# Patient Record
Sex: Female | Born: 1985 | Race: Black or African American | Hispanic: No | Marital: Single | Smoking: Current every day smoker
Health system: Southern US, Community
[De-identification: ages and names within clinical notes are randomized; demographics above are authoritative.]

## PROBLEM LIST (undated history)

## (undated) DIAGNOSIS — M7072 Other bursitis of hip, left hip: Secondary | ICD-10-CM

## (undated) DIAGNOSIS — C801 Malignant (primary) neoplasm, unspecified: Secondary | ICD-10-CM

## (undated) DIAGNOSIS — M722 Plantar fascial fibromatosis: Secondary | ICD-10-CM

## (undated) DIAGNOSIS — S0300XA Dislocation of jaw, unspecified side, initial encounter: Secondary | ICD-10-CM

## (undated) DIAGNOSIS — J45909 Unspecified asthma, uncomplicated: Secondary | ICD-10-CM

## (undated) HISTORY — PX: LAPAROSCOPIC OVARIAN CYSTECTOMY: SUR786

---
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
## 2009-04-10 ENCOUNTER — Ambulatory Visit: Payer: Self-pay | Admitting: Physician Assistant

## 2009-04-10 ENCOUNTER — Inpatient Hospital Stay (HOSPITAL_COMMUNITY): Admission: AD | Admit: 2009-04-10 | Discharge: 2009-04-10 | Payer: Self-pay | Admitting: Obstetrics & Gynecology

## 2010-08-25 LAB — URINALYSIS, ROUTINE W REFLEX MICROSCOPIC
Bilirubin Urine: NEGATIVE
Glucose, UA: NEGATIVE mg/dL
Hgb urine dipstick: NEGATIVE
Ketones, ur: NEGATIVE mg/dL
Nitrite: NEGATIVE
Protein, ur: NEGATIVE mg/dL
Specific Gravity, Urine: >1.03 — ABNORMAL HIGH (ref 1.005–1.030)
Urobilinogen, UA: 0.2 mg/dL (ref 0.0–1.0)
pH: 5.5 (ref 5.0–8.0)

## 2010-08-25 LAB — WET PREP, GENITAL
Trich, Wet Prep: NONE SEEN
Yeast Wet Prep HPF POC: NONE SEEN

## 2010-08-25 LAB — POCT PREGNANCY, URINE: Preg Test, Ur: NEGATIVE

## 2010-08-25 LAB — GC/CHLAMYDIA PROBE AMP, GENITAL
Chlamydia, DNA Probe: NEGATIVE
GC Probe Amp, Genital: NEGATIVE

## 2013-12-12 ENCOUNTER — Emergency Department (HOSPITAL_BASED_OUTPATIENT_CLINIC_OR_DEPARTMENT_OTHER)
Admission: EM | Admit: 2013-12-12 | Discharge: 2013-12-12 | Disposition: A | Discharge status: Home / Self Care | Payer: Self-pay | Attending: Emergency Medicine | Admitting: Emergency Medicine

## 2013-12-12 ENCOUNTER — Encounter (HOSPITAL_BASED_OUTPATIENT_CLINIC_OR_DEPARTMENT_OTHER): Payer: Self-pay | Admitting: Emergency Medicine

## 2013-12-12 DIAGNOSIS — M26659 Arthropathy of unspecified temporomandibular joint: Secondary | ICD-10-CM

## 2013-12-12 DIAGNOSIS — Z791 Long term (current) use of non-steroidal anti-inflammatories (NSAID): Secondary | ICD-10-CM | POA: Insufficient documentation

## 2013-12-12 DIAGNOSIS — Z3202 Encounter for pregnancy test, result negative: Secondary | ICD-10-CM | POA: Insufficient documentation

## 2013-12-12 DIAGNOSIS — M26609 Unspecified temporomandibular joint disorder, unspecified side: Secondary | ICD-10-CM | POA: Insufficient documentation

## 2013-12-12 DIAGNOSIS — Z79899 Other long term (current) drug therapy: Secondary | ICD-10-CM | POA: Insufficient documentation

## 2013-12-12 DIAGNOSIS — K029 Dental caries, unspecified: Secondary | ICD-10-CM | POA: Insufficient documentation

## 2013-12-12 DIAGNOSIS — F17200 Nicotine dependence, unspecified, uncomplicated: Secondary | ICD-10-CM | POA: Insufficient documentation

## 2013-12-12 DIAGNOSIS — F458 Other somatoform disorders: Secondary | ICD-10-CM

## 2013-12-12 DIAGNOSIS — G4763 Sleep related bruxism: Secondary | ICD-10-CM | POA: Insufficient documentation

## 2013-12-12 DIAGNOSIS — J45909 Unspecified asthma, uncomplicated: Secondary | ICD-10-CM | POA: Insufficient documentation

## 2013-12-12 HISTORY — DX: Dislocation of jaw, unspecified side, initial encounter: S03.00XA

## 2013-12-12 HISTORY — DX: Unspecified asthma, uncomplicated: J45.909

## 2013-12-12 LAB — PREGNANCY, URINE: PREG TEST UR: NEGATIVE

## 2013-12-12 MED ORDER — ONDANSETRON 8 MG PO TBDP
8.0000 mg | ORAL_TABLET | Freq: Once | ORAL | Status: AC
  Administered 2013-12-12: 8 mg via ORAL
  Filled 2013-12-12: qty 1

## 2013-12-12 MED ORDER — DEXAMETHASONE SODIUM PHOSPHATE 4 MG/ML IJ SOLN
INTRAMUSCULAR | Status: AC
  Administered 2013-12-12: 8 mg
  Filled 2013-12-12: qty 2

## 2013-12-12 MED ORDER — PENICILLIN V POTASSIUM 250 MG PO TABS
500.0000 mg | ORAL_TABLET | Freq: Once | ORAL | Status: AC
Start: 2013-12-12 — End: 2013-12-12
  Administered 2013-12-12: 500 mg via ORAL
  Filled 2013-12-12: qty 2

## 2013-12-12 MED ORDER — KETOROLAC TROMETHAMINE 60 MG/2ML IM SOLN
60.0000 mg | Freq: Once | INTRAMUSCULAR | Status: AC
  Administered 2013-12-12: 60 mg via INTRAMUSCULAR
  Filled 2013-12-12: qty 2

## 2013-12-12 MED ORDER — DEXAMETHASONE SODIUM PHOSPHATE 10 MG/ML IJ SOLN: 8.0000 mg | Freq: Once | INTRAMUSCULAR | Status: DC

## 2013-12-12 MED ORDER — PENICILLIN V POTASSIUM 500 MG PO TABS: 500.0000 mg | ORAL_TABLET | Freq: Four times a day (QID) | ORAL | Status: AC

## 2013-12-12 MED ORDER — DEXAMETHASONE SODIUM PHOSPHATE 10 MG/ML IJ SOLN: 10.0000 mg | Freq: Once | INTRAMUSCULAR | Status: DC

## 2013-12-12 MED ORDER — NAPROXEN 500 MG PO TABS: 500.0000 mg | ORAL_TABLET | Freq: Two times a day (BID) | ORAL | Status: DC

## 2013-12-12 NOTE — ED Notes (Signed)
Pt c/o headache x2wks with having had TMJ since 10/22/13, was treaded at [facility] but unable to afford the meds

## 2013-12-12 NOTE — Discharge Instructions (Signed)
°Emergency Department Resource Guide °1) Find a Doctor and Pay Out of Pocket °Although you won't have to find out who is covered by your insurance plan, it is a good idea to ask around and get recommendations. You will then need to call the office and see if the doctor you have chosen will accept you as a new patient and what types of options they offer for patients who are self-pay. Some doctors offer discounts or will set up payment plans for their patients who do not have insurance, but you will need to ask so you aren't surprised when you get to your appointment. ° °2) Contact Your Local Health Department °Not all health departments have doctors that can see patients for sick visits, but many do, so it is worth a call to see if yours does. If you don't know where your local health department is, you can check in your phone book. The CDC also has a tool to help you locate your state's health department, and many state websites also have listings of all of their local health departments. ° °3) Find a Walk-in Clinic °If your illness is not likely to be very severe or complicated, you may want to try a walk in clinic. These are popping up all over the country in pharmacies, drugstores, and shopping centers. They're usually staffed by nurse practitioners or physician assistants that have been trained to treat common illnesses and complaints. They're usually fairly quick and inexpensive. However, if you have serious medical issues or chronic medical problems, these are probably not your best option. ° °No Primary Care Doctor: °- Call [facility] at  832-8000 - they can help you locate a primary care doctor that  accepts your insurance, provides certain services, etc. °- Physician Referral Service- 1-800-533-3463 ° °Chronic Pain Problems: °Organization         Address  Phone   Notes  °[facility]  (336) 297-2271 Patients need to be referred by their primary care doctor.  ° °Medication  Assistance: °Organization         Address  Phone   Notes  °[place] [facility] [address] °[place], [place] [address] (336) 641-8030 --Must be a resident of [place] °-- Must have NO insurance coverage whatsoever (no Medicaid/ Medicare, etc.) °-- The pt. MUST have a primary care doctor that directs their care regularly and follows them in the community °  °[facility]  (866) 331-1348   °[employer]  (888) 892-1162   ° °Agencies that provide inexpensive medical care: °Organization         Address  Phone   Notes  °[facility]  (336) 832-8035   °[facility]    (336) 832-7272   °[facility] [address] °[place], [place] [address] (336) 832-4777   °[facility] [address], °[place] (336) 271-4999   °[facility]    (336) 373-0678   °[facility]    (336) 272-1050   °[facility] ° [address], [place] Phone:  (336) 832-4444, Fax:  (336) 832-4440 Hours of Operation:  9 am - 6 pm, M-F.  Also accepts Medicaid/Medicare and self-pay.  °[facility] ° [address], [place] Phone: (336) 832-3150, Fax: (336) 832-3151. Hours of Operation:  8:30 am - 5:30 pm, M-F.  Also accepts Medicaid and self-pay.  °[facility] [place] [address], [place] Phone: (336) 878-6027   °[facility] [address], [place], [place] (336)723-1848, Ext. 123 Mondays & Thursdays: 7-9 AM.  First 15 patients are seen on a first come, first serve basis. °  ° °Medicaid-accepting [place] Providers: ° °Organization         Address  Phone   Notes  °[facility] [address], [place] (336) 641-2100 Also accepts self-pay patients.  °[facility] [address], [place] ° (336) 856-9996   °[facility] [address], [place]  (336) 288-8857   °[facility] [address] [place] [address], [place] (336) 299-7000   °Veita Bland [address], [place]  ° (336) 373-1557 Only accepts [place] Access Medicaid patients after they have their name applied to their card.  ° °Self-Pay (no insurance) in [place]: ° °Organization         Address  Phone   Notes  °Sickle Cell Patients, [facility] [address], [place] (336) 832-1970   °[facility] [address], [place] (336) 832-4400   °[facility] ° [address] [place] [address],  (336) 992-4800   °[facility]/Dr. Osei-Bonsu ° [address] [place] [address], [place] or [address], [place] (336) 841-8500 Phone number for both [place] and [place] locations is the same.  °[facility] [address], [place] (336) 299-0000   °[facility] [address] [place] [address], [place] or [address] (336) 852-7530 °(336) 878-2260   °[facility] [address], [place] (336) 350-1642, phone; (336) 294-5005, fax Sees patients 1st and 3rd Saturday of every month.  Must not qualify for public or private insurance (i.e. Medicaid, Medicare, [place] Health Choice, Veterans' Benefits) • Household income should be no more than 200% of the poverty level •The clinic cannot treat you if you are pregnant or think you are pregnant • Sexually transmitted diseases are not treated at the clinic.  ° ° °Dental Care: °Organization         Address  Phone  Notes  °[place] [facility] [address], [place] (336) 641-6152 Accepts children up to age 21 who are enrolled in Medicaid or [place] Health Choice; pregnant women with a Medicaid card; and children who have applied for Medicaid or [place] Health Choice, but were declined, whose parents can pay a reduced fee at time of service.  °[place]  [facility] [place]  [address], [place] (336) 641-7733 Accepts children up to age 21 who are enrolled in Medicaid or [place] Health Choice; pregnant women with a Medicaid card; and children who have applied for Medicaid or [place] Health Choice, but were declined, whose parents can pay a reduced fee at time of service.  °[facility] ° [address], [place] (336) 641-4533 Patients are seen by appointment only. Walk-ins are not accepted. [facility] will see patients 18 years of age and older. °Monday - Tuesday (8am-5pm) °Most Wednesdays (8:30-5pm) °$30 per visit, cash only  °[facility] ° [address], [place] (336) 641-4533 Patients are seen by appointment only. Walk-ins are not accepted. [facility] will see patients 18 years of age and older. °One   Wednesday Evening (Monthly: Volunteer Based).  $30 per visit, cash only  °[facility]  (919) 537-3737 for adults; Children under age 4, call Graduate Pediatric Dentistry at (919) [phone]. Children aged 4-14, please call (919) [phone] to request a pediatric application. ° Dental services are provided in all areas of dental care including fillings, crowns and bridges, complete and partial dentures, implants, gum treatment, root canals, and extractions. Preventive care is also provided. Treatment is provided to both adults and children. °Patients are selected via a lottery and there is often a waiting list. °  °[facility] [address], °[place] ° (336) 763-8833 [email or web address] °  °[facility] [address], [place], [place] (336)723-1848, Ext. 123 Second and Fourth Thursday of each month, opens at 6:30 AM; Clinic ends at 9 AM.  Patients are seen on a first-come first-served basis, and a limited number are seen during each clinic.  ° °[facility] ° [address], [place], [place] (336) 723-7904    Eligibility Requirements °You must have lived in [place], [place], or [place] counties for at least the last three months. °  You cannot be eligible for state or federal sponsored healthcare insurance, including Veterans Administration, Medicaid, or Medicare. °  You generally cannot be eligible for healthcare insurance through your employer.  °  How to apply: °Eligibility screenings are held every Tuesday and Wednesday afternoon from 1:00 pm until 4:00 pm. You do not need an appointment for the interview!  °[facility] [address], [place], [place] 336-631-2330   °[facility] [facility]  336-342-8273   °[facility]  336-703-3100   °[facility]  336-570-6415   ° °Behavioral Health Resources in the Community: °Intensive Outpatient Programs °Organization         Address  Phone  Notes  °[facility] [address], [place], [place] 336-878-6098   °[facility] [address], [place], [place] 336-832-9800   °[facility] [address], [place], [place] ° 336-882-2125   °[facility] [address],  °[place], [place] 1-800-853-5163 or 336-641-4981   °Substance Abuse Resources °Organization         Address  Phone  Notes  °[facility]  336-882-2125   °[facility]  336-784-9470   °[facility]  336-285-9073   °[facility]  336-845-3988   °Residential & Outpatient Substance Abuse Program  1-800-659-3381   °Psychological Services °Organization         Address  Phone  Notes  °[facility]  336- 832-9600   °[facility]  336- 378-7881   °[facility] [address], [place] 1-800-853-5163 or 336-641-4981   ° °Mobile Crisis Teams °Organization         Address  Phone  Notes  °[facility], Mobile Crisis Care Unit  1-877-626-1772   °Assertive °[facility] ° [address]  [place], [place] 336-834-9664   °Sharon DeEsch [address] °[place] 336-554-5454   ° °Self-Help/Support Groups °Organization         Address  Phone             Notes  °[facility] [place] - variety of support groups  336- 373-1402 Call for more information  °Narcotics Anonymous (NA), [facility] [address], °[place]   2 meetings at this location  ° °  Residential Treatment Programs °Organization         Address  Phone  Notes  °[facility] [address],    °[place]  1-866-801-8205   °[facility] ° [address], [place], [place] 704-293-8524   °[facility] [address], [place] 336-845-3988 Admissions: 8am-3pm M-F  °[facility] [address]    °[place], [place] 336-841-1104   °[facility] [address], [place], [place] 336-379-7146   °[facility] [address]  °[place], [place] 336-285-9073   °[facility] - Intensive Outpatient [address], [place], [place] 336-852-3033   °[facility] [address]  °[place], [place] 1-877-615-2722 or 336-784-9470   °[facility] [address] [place], [place] 336-227-7417 Accepts Medicaid  °[facility] [address]  °[place] 1-800-659-3381 Substance Abuse/Addiction Treatment  ° °[place] Behavioral Health Resources °Organization         Address  Phone  Notes  °[facility]  (888) 581-9988   °Julie Brannon, PhD [address] [place], [place]   (336) 349-5553 or (336) 951-0000   °[facility]   [address] °[place], [place] (336) 349-4454   °[facility] [address], [place], [place] (336) 342-8316 Insurance/Medicaid/sponsorship through [facility]  °[facility] [address]                                    [place], [place] (336) 342-8316 Therapy/tele-psych/case    °[facility] [address]  ° [place], [place] (336) 349-2233    °Dr. Arfeen  (336) 349-4544   °[facility] [place]  [employer] [place] [facility] 1) [address], [place] °2) [address], [place] °3)  [address], [place] (336) 349-3220 °(336) 342-7768 ° °(336) 342-8140   °[place] [facility] (336) 342-1394 or (336) 342-3537 (After Hours)    ° ° °

## 2013-12-12 NOTE — ED Notes (Signed)
MD at bedside. 

## 2013-12-12 NOTE — ED Provider Notes (Signed)
CSN: [id number]     Arrival date & time 12/12/13  0258 History   First MD Initiated Contact with Patient 12/12/13 [phone]     Chief Complaint  Patient presents with  . Headache     (Consider location/radiation/quality/duration/timing/severity/associated sxs/prior Treatment) Patient is a 28 y.o. female presenting with headaches. The history is provided by the patient.  Headache Pain location:  L temporal and R temporal Quality:  Dull Radiates to:  Does not radiate Severity currently:  9/10 Severity at highest:  9/10 Onset quality:  Gradual Timing:  Constant Progression:  Unchanged Chronicity:  New Context: not activity and not loud noise   Relieved by:  Nothing Worsened by:  Nothing tried Ineffective treatments: tylenol. Associated symptoms: facial pain   Associated symptoms: no blurred vision, no congestion, no diarrhea, no ear pain, no fever, no hearing loss, no myalgias, no neck pain, no neck stiffness, no paresthesias, no photophobia, no sinus pressure, no sore throat, no URI and no weakness   Risk factors: no anger     Past Medical History  Diagnosis Date  . TMJ (dislocation of temporomandibular joint)   . Asthma    Past Surgical History  Procedure Laterality Date  . Laparoscopic ovarian cystectomy     No family history on file. History  Substance Use Topics  . Smoking status: Current Some Day Smoker  . Smokeless tobacco: Not on file  . Alcohol Use: No   OB History   Grav Para Term Preterm Abortions TAB SAB Ect Mult Living                 Review of Systems  Constitutional: Negative for fever.  HENT: Negative for congestion, drooling, ear pain, facial swelling, hearing loss, rhinorrhea, sinus pressure, sore throat, trouble swallowing and voice change.        Jaw pain  Eyes: Negative for blurred vision and photophobia.  Respiratory: Negative for shortness of breath.   Gastrointestinal: Negative for diarrhea.  Musculoskeletal: Negative for myalgias, neck pain  and neck stiffness.  Neurological: Positive for headaches. Negative for facial asymmetry, speech difficulty, weakness, light-headedness and paresthesias.  All other systems reviewed and are negative.     Allergies  Review of patient's allergies indicates no known allergies.  Home Medications   Prior to Admission medications   Medication Sig Start Date End Date Taking? Authorizing Provider  acetaminophen (TYLENOL) 500 MG tablet Take 500 mg by mouth every 6 (six) hours as needed.   Yes Historical Provider, MD  ibuprofen (ADVIL,MOTRIN) 200 MG tablet Take 200 mg by mouth every 6 (six) hours as needed.   Yes Historical Provider, MD  naproxen (NAPROSYN) 500 MG tablet Take 1 tablet (500 mg total) by mouth 2 (two) times daily. 12/12/13   Numair Masden K Ferrel [place]-Rasch, MD  penicillin v potassium (VEETID) 500 MG tablet Take 1 tablet (500 mg total) by mouth 4 (four) times daily. 12/12/13 12/19/13  Kayla Addison K Romaldo Saville-Rasch, MD   BP 148/93  Pulse 78  Temp(Src) 99 F (37.2 C) (Oral)  Resp 20  SpO2 100%  LMP 12/05/2013 Physical Exam  Constitutional: She is oriented to person, place, and time. She appears well-developed and well-nourished. No distress.  HENT:  Head: Normocephalic and atraumatic.  Mouth/Throat: Oropharynx is clear and moist. No oral lesions. No trismus in the jaw. No uvula swelling. No posterior oropharyngeal edema or posterior oropharyngeal erythema.    Eyes: Conjunctivae and EOM are normal. Pupils are equal, round, and reactive to light.  Neck: Normal range of  motion. Neck supple.  Cardiovascular: Normal rate, regular rhythm and intact distal pulses.   Pulmonary/Chest: Effort normal and breath sounds normal. No respiratory distress. She has no wheezes. She has no rales.  Abdominal: Soft. Bowel sounds are normal. There is no tenderness. There is no rebound and no guarding.  Musculoskeletal: Normal range of motion. She exhibits no edema and no tenderness.  Neurological: She is alert and  oriented to person, place, and time. She has normal reflexes. No cranial nerve deficit.  5/5 throughout  Skin: Skin is warm and dry.  Psychiatric: She has a normal mood and affect.    ED Course  Procedures (including critical care time) Labs Review Labs Reviewed  PREGNANCY, URINE    Imaging Review No results found.   EKG Interpretation None      MDM   Final diagnoses:  TMJ arthropathy  Dental caries  Bruxism (teeth grinding)   Headache is clearly related to TMJ and bite malalignment from teeth grinding.  There are also caries on the RL molars. Dental caries secondary to hypomineralization and grinding.  Will treat with NSAIDs, PCN, recommend dental follow up and mouth guard mouth rinses, good oral hygiene and soft diet.     Carlisle Beers, MD 12/12/13 [phone]

## 2013-12-12 NOTE — ED Notes (Signed)
MD at bedside. Pt given ginger ale to trial.

## 2014-02-18 ENCOUNTER — Emergency Department (HOSPITAL_BASED_OUTPATIENT_CLINIC_OR_DEPARTMENT_OTHER): Payer: Self-pay

## 2014-02-18 ENCOUNTER — Encounter (HOSPITAL_BASED_OUTPATIENT_CLINIC_OR_DEPARTMENT_OTHER): Payer: Self-pay | Admitting: Emergency Medicine

## 2014-02-18 ENCOUNTER — Emergency Department (HOSPITAL_BASED_OUTPATIENT_CLINIC_OR_DEPARTMENT_OTHER)
Admission: EM | Admit: 2014-02-18 | Discharge: 2014-02-18 | Disposition: A | Discharge status: Home / Self Care | Payer: Self-pay | Attending: Emergency Medicine | Admitting: Emergency Medicine

## 2014-02-18 DIAGNOSIS — R5383 Other fatigue: Secondary | ICD-10-CM

## 2014-02-18 DIAGNOSIS — Z79899 Other long term (current) drug therapy: Secondary | ICD-10-CM | POA: Insufficient documentation

## 2014-02-18 DIAGNOSIS — N898 Other specified noninflammatory disorders of vagina: Secondary | ICD-10-CM | POA: Insufficient documentation

## 2014-02-18 DIAGNOSIS — B349 Viral infection, unspecified: Secondary | ICD-10-CM

## 2014-02-18 DIAGNOSIS — J45909 Unspecified asthma, uncomplicated: Secondary | ICD-10-CM | POA: Insufficient documentation

## 2014-02-18 DIAGNOSIS — R509 Fever, unspecified: Secondary | ICD-10-CM | POA: Insufficient documentation

## 2014-02-18 DIAGNOSIS — Z3202 Encounter for pregnancy test, result negative: Secondary | ICD-10-CM | POA: Insufficient documentation

## 2014-02-18 DIAGNOSIS — R5381 Other malaise: Secondary | ICD-10-CM | POA: Insufficient documentation

## 2014-02-18 DIAGNOSIS — R0982 Postnasal drip: Secondary | ICD-10-CM | POA: Insufficient documentation

## 2014-02-18 DIAGNOSIS — R1084 Generalized abdominal pain: Secondary | ICD-10-CM | POA: Insufficient documentation

## 2014-02-18 DIAGNOSIS — IMO0001 Reserved for inherently not codable concepts without codable children: Secondary | ICD-10-CM | POA: Insufficient documentation

## 2014-02-18 DIAGNOSIS — B9789 Other viral agents as the cause of diseases classified elsewhere: Secondary | ICD-10-CM | POA: Insufficient documentation

## 2014-02-18 DIAGNOSIS — F17200 Nicotine dependence, unspecified, uncomplicated: Secondary | ICD-10-CM | POA: Insufficient documentation

## 2014-02-18 DIAGNOSIS — Z87828 Personal history of other (healed) physical injury and trauma: Secondary | ICD-10-CM | POA: Insufficient documentation

## 2014-02-18 LAB — URINALYSIS, ROUTINE W REFLEX MICROSCOPIC
Bilirubin Urine: NEGATIVE
GLUCOSE, UA: NEGATIVE mg/dL
Hgb urine dipstick: NEGATIVE
Ketones, ur: NEGATIVE mg/dL
LEUKOCYTES UA: NEGATIVE
NITRITE: NEGATIVE
PH: 6 (ref 5.0–8.0)
Protein, ur: NEGATIVE mg/dL
SPECIFIC GRAVITY, URINE: 1.022 (ref 1.005–1.030)
Urobilinogen, UA: 0.2 mg/dL (ref 0.0–1.0)

## 2014-02-18 LAB — COMPREHENSIVE METABOLIC PANEL
ALBUMIN: 3.5 g/dL (ref 3.5–5.2)
ALT: 16 U/L (ref 0–35)
AST: 16 U/L (ref 0–37)
Alkaline Phosphatase: 58 U/L (ref 39–117)
Anion gap: 14 (ref 5–15)
BUN: 7 mg/dL (ref 6–23)
CALCIUM: 8.7 mg/dL (ref 8.4–10.5)
CO2: 21 meq/L (ref 19–32)
CREATININE: 0.6 mg/dL (ref 0.50–1.10)
Chloride: 105 mEq/L (ref 96–112)
GFR calc Af Amer: >90 mL/min (ref >90)
GFR calc non Af Amer: >90 mL/min (ref >90)
Glucose, Bld: 114 mg/dL — ABNORMAL HIGH (ref 70–99)
Potassium: 3.7 mEq/L (ref 3.7–5.3)
SODIUM: 140 meq/L (ref 137–147)
Total Bilirubin: 0.4 mg/dL (ref 0.3–1.2)
Total Protein: 7.1 g/dL (ref 6.0–8.3)

## 2014-02-18 LAB — LIPASE, BLOOD: LIPASE: 20 U/L (ref 11–59)

## 2014-02-18 LAB — CBC WITH DIFFERENTIAL/PLATELET
BASOS ABS: 0 10*3/uL (ref 0.0–0.1)
BASOS PCT: 0 % (ref 0–1)
EOS PCT: 0 % (ref 0–5)
Eosinophils Absolute: 0 10*3/uL (ref 0.0–0.7)
HEMATOCRIT: 35.6 % — AB (ref 36.0–46.0)
Hemoglobin: 11.3 g/dL — ABNORMAL LOW (ref 12.0–15.0)
LYMPHS PCT: 11 % — AB (ref 12–46)
Lymphs Abs: 0.5 10*3/uL — ABNORMAL LOW (ref 0.7–4.0)
MCH: 27.8 pg (ref 26.0–34.0)
MCHC: 31.7 g/dL (ref 30.0–36.0)
MCV: 87.5 fL (ref 78.0–100.0)
MONO ABS: 0.4 10*3/uL (ref 0.1–1.0)
Monocytes Relative: 9 % (ref 3–12)
NEUTROS ABS: 3.6 10*3/uL (ref 1.7–7.7)
Neutrophils Relative %: 80 % — ABNORMAL HIGH (ref 43–77)
PLATELETS: 153 10*3/uL (ref 150–400)
RBC: 4.07 MIL/uL (ref 3.87–5.11)
RDW: 12.8 % (ref 11.5–15.5)
WBC: 4.5 10*3/uL (ref 4.0–10.5)

## 2014-02-18 LAB — PREGNANCY, URINE: PREG TEST UR: NEGATIVE

## 2014-02-18 MED ORDER — ACETAMINOPHEN 325 MG PO TABS
650.0000 mg | ORAL_TABLET | Freq: Once | ORAL | Status: AC
  Administered 2014-02-18: 650 mg via ORAL

## 2014-02-18 MED ORDER — ACETAMINOPHEN 325 MG PO TABS
ORAL_TABLET | ORAL | Status: AC
  Filled 2014-02-18: qty 2

## 2014-02-18 MED ORDER — METRONIDAZOLE 500 MG PO TABS: 500.0000 mg | ORAL_TABLET | Freq: Two times a day (BID) | ORAL | Status: DC

## 2014-02-18 NOTE — Discharge Instructions (Signed)

## 2014-02-18 NOTE — ED Notes (Addendum)
Pt c/o body aches fever congestion and cough x 6 hrs seen at [facility] ED last night DX BV

## 2014-02-18 NOTE — ED Notes (Signed)
MD at bedside. 

## 2014-02-18 NOTE — ED Provider Notes (Signed)
CSN: [id number]     Arrival date & time 02/18/14  0807 History   First MD Initiated Contact with Patient 02/18/14 0820     Chief Complaint  Patient presents with  . Fever     (Consider location/radiation/quality/duration/timing/severity/associated sxs/prior Treatment) HPI Comments: Pt presents with fever that started today.  She has had some right side abd cramps for about 1.5 weeks.  She went to [facility] ED yesterday and had a pelvic exam showing BV.  She also had some blood in her urine and says that she was told that she had a small amount of blood on her vaginal exam.  Denies dysuria or frequency.  Today, started having fever up to 102.  Has some mucus in her throat, but no significant sore throat.  +cough with chest congestion.  Diffuse myalgias.  Diffuse discomfort around her abdomen.  +nausea, but no vomiting or diarrhea.  No rash.  Patient is a 28 y.o. female presenting with fever.  Fever Associated symptoms: congestion, cough, myalgias and nausea   Associated symptoms: no chest pain, no chills, no diarrhea, no headaches, no rash, no rhinorrhea and no vomiting     Past Medical History  Diagnosis Date  . TMJ (dislocation of temporomandibular joint)   . Asthma    Past Surgical History  Procedure Laterality Date  . Laparoscopic ovarian cystectomy     History reviewed. No pertinent family history. History  Substance Use Topics  . Smoking status: Current Every Day Smoker -- 0.50 packs/day    Types: Cigarettes  . Smokeless tobacco: Not on file  . Alcohol Use: No   OB History   Grav Para Term Preterm Abortions TAB SAB Ect Mult Living                 Review of Systems  Constitutional: Positive for fever and fatigue. Negative for chills and diaphoresis.  HENT: Positive for congestion and postnasal drip. Negative for rhinorrhea and sneezing.   Eyes: Negative.   Respiratory: Positive for cough. Negative for chest tightness and shortness of breath.   Cardiovascular: Negative for  chest pain and leg swelling.  Gastrointestinal: Positive for nausea and abdominal pain. Negative for vomiting, diarrhea and blood in stool.  Genitourinary: Positive for vaginal discharge. Negative for frequency, hematuria, flank pain, vaginal bleeding and difficulty urinating.  Musculoskeletal: Positive for myalgias. Negative for arthralgias and back pain.  Skin: Negative for rash.  Neurological: Negative for dizziness, speech difficulty, weakness, numbness and headaches.      Allergies  Review of patient's allergies indicates no known allergies.  Home Medications   Prior to Admission medications   Medication Sig Start Date End Date Taking? Authorizing Provider  acetaminophen (TYLENOL) 500 MG tablet Take 500 mg by mouth every 6 (six) hours as needed.    Historical Provider, MD  ibuprofen (ADVIL,MOTRIN) 200 MG tablet Take 200 mg by mouth every 6 (six) hours as needed.    Historical Provider, MD  metroNIDAZOLE (FLAGYL) 500 MG tablet Take 1 tablet (500 mg total) by mouth 2 (two) times daily. One po bid x 7 days 02/18/14   Malvin Johns, MD  naproxen (NAPROSYN) 500 MG tablet Take 1 tablet (500 mg total) by mouth 2 (two) times daily. 12/12/13   April K Palumbo-Rasch, MD   BP 133/59  Pulse 81  Temp(Src) 99.2 F (37.3 C) (Oral)  Resp 20  Ht 5\' 7"  (1.702 m)  Wt 280 lb (127.007 kg)  BMI 43.84 kg/m2  SpO2 99%  LMP 12/21/2013 Physical  Exam  Constitutional: She is oriented to person, place, and time. She appears well-developed and well-nourished. No distress.  HENT:  Head: Normocephalic and atraumatic.  Mouth/Throat: Oropharynx is clear and moist. No oropharyngeal exudate.  Eyes: Pupils are equal, round, and reactive to light.  Neck: Normal range of motion. Neck supple.  Cardiovascular: Normal rate, regular rhythm and normal heart sounds.   Pulmonary/Chest: Effort normal and breath sounds normal. No respiratory distress. She has no wheezes. She has no rales. She exhibits no tenderness.   Abdominal: Soft. Bowel sounds are normal. There is tenderness (mild diffuse tenderness). There is no rebound and no guarding.  Musculoskeletal: Normal range of motion. She exhibits no edema.  Lymphadenopathy:    She has no cervical adenopathy.  Neurological: She is alert and oriented to person, place, and time.  Skin: Skin is warm and dry. No rash noted.  Psychiatric: She has a normal mood and affect.    ED Course  Procedures (including critical care time) Labs Review Labs Reviewed  CBC WITH DIFFERENTIAL - Abnormal; Notable for the following:    Hemoglobin 11.3 (*)    HCT 35.6 (*)    Neutrophils Relative % 80 (*)    Lymphocytes Relative 11 (*)    Lymphs Abs 0.5 (*)    All other components within normal limits  COMPREHENSIVE METABOLIC PANEL - Abnormal; Notable for the following:    Glucose, Bld 114 (*)    All other components within normal limits  LIPASE, BLOOD  URINALYSIS, ROUTINE W REFLEX MICROSCOPIC  PREGNANCY, URINE    Imaging Review Dg Abd Acute W/chest  02/18/2014   CLINICAL DATA:  Several day history of abdominal and low back pain as well as fever  EXAM: ACUTE ABDOMEN SERIES (ABDOMEN 2 VIEW & CHEST 1 VIEW)  COMPARISON:  None.  FINDINGS: The lungs are adequately inflated. There is no focal infiltrate. The heart and mediastinal structures are normal.  Within the abdomen the bowel gas pattern is nonspecific. There are small amounts of small bowel gas. A normal stool and gas pattern within the colon is present. There are no free extraluminal gas collections. There are no abnormal soft tissue calcifications. The bony structures are unremarkable.  IMPRESSION: 1. There is no acute cardiopulmonary abnormality. 2. There is no evidence of bowel obstruction. There are small amounts of small bowel gas which may be normal for the patient or could reflect mild enteritis.   Electronically Signed   By: David  Martinique   On: 02/18/2014 09:26     EKG Interpretation None      MDM   Final  diagnoses:  Viral syndrome   Patient presents with fever, cough and myalgias. She did have some abdominal pain initially. Her labs are unremarkable. There is no evidence of pneumonia. Her urine is negative for infection. I reexamined her abdomen reveals only mild diffuse tenderness. Exam is not concerning at this time for cholecystitis, appendicitis or renal colic. I feel she likely has a viral infection. She was advised in symptomatic care. She does have a history of recent bacterial vaginosis but was unable to get her MetroGel filled due to the cost. I gave her a prescription for oral Flagyl. Encouraged her to followup with her primary care physician return here she has any worsening symptoms.    Malvin Johns, MD 02/18/14 [phone]

## 2014-04-10 ENCOUNTER — Encounter (HOSPITAL_BASED_OUTPATIENT_CLINIC_OR_DEPARTMENT_OTHER): Payer: Self-pay | Admitting: Emergency Medicine

## 2014-04-10 ENCOUNTER — Emergency Department (HOSPITAL_BASED_OUTPATIENT_CLINIC_OR_DEPARTMENT_OTHER)
Admission: EM | Admit: 2014-04-10 | Discharge: 2014-04-11 | Disposition: A | Discharge status: Home / Self Care | Payer: Self-pay | Attending: Emergency Medicine | Admitting: Emergency Medicine

## 2014-04-10 DIAGNOSIS — R1084 Generalized abdominal pain: Secondary | ICD-10-CM | POA: Insufficient documentation

## 2014-04-10 DIAGNOSIS — Z9889 Other specified postprocedural states: Secondary | ICD-10-CM | POA: Insufficient documentation

## 2014-04-10 DIAGNOSIS — K5900 Constipation, unspecified: Secondary | ICD-10-CM | POA: Insufficient documentation

## 2014-04-10 DIAGNOSIS — Z791 Long term (current) use of non-steroidal anti-inflammatories (NSAID): Secondary | ICD-10-CM | POA: Insufficient documentation

## 2014-04-10 DIAGNOSIS — Z720 Tobacco use: Secondary | ICD-10-CM | POA: Insufficient documentation

## 2014-04-10 DIAGNOSIS — J45901 Unspecified asthma with (acute) exacerbation: Secondary | ICD-10-CM | POA: Insufficient documentation

## 2014-04-10 DIAGNOSIS — R51 Headache: Secondary | ICD-10-CM | POA: Insufficient documentation

## 2014-04-10 DIAGNOSIS — R1030 Lower abdominal pain, unspecified: Secondary | ICD-10-CM

## 2014-04-10 DIAGNOSIS — Z87828 Personal history of other (healed) physical injury and trauma: Secondary | ICD-10-CM | POA: Insufficient documentation

## 2014-04-10 DIAGNOSIS — R630 Anorexia: Secondary | ICD-10-CM | POA: Insufficient documentation

## 2014-04-10 DIAGNOSIS — Z3202 Encounter for pregnancy test, result negative: Secondary | ICD-10-CM | POA: Insufficient documentation

## 2014-04-10 DIAGNOSIS — R112 Nausea with vomiting, unspecified: Secondary | ICD-10-CM | POA: Insufficient documentation

## 2014-04-10 DIAGNOSIS — N898 Other specified noninflammatory disorders of vagina: Secondary | ICD-10-CM | POA: Insufficient documentation

## 2014-04-10 LAB — URINALYSIS, ROUTINE W REFLEX MICROSCOPIC
Glucose, UA: NEGATIVE mg/dL
HGB URINE DIPSTICK: NEGATIVE
Ketones, ur: 15 mg/dL — AB
LEUKOCYTES UA: NEGATIVE
NITRITE: NEGATIVE
PROTEIN: NEGATIVE mg/dL
SPECIFIC GRAVITY, URINE: 1.039 — AB (ref 1.005–1.030)
UROBILINOGEN UA: 1 mg/dL (ref 0.0–1.0)
pH: 5.5 (ref 5.0–8.0)

## 2014-04-10 LAB — COMPREHENSIVE METABOLIC PANEL
ALBUMIN: 3.5 g/dL (ref 3.5–5.2)
ALT: 17 U/L (ref 0–35)
AST: 16 U/L (ref 0–37)
Alkaline Phosphatase: 63 U/L (ref 39–117)
Anion gap: 13 (ref 5–15)
BUN: 8 mg/dL (ref 6–23)
CALCIUM: 8.8 mg/dL (ref 8.4–10.5)
CHLORIDE: 105 meq/L (ref 96–112)
CO2: 25 mEq/L (ref 19–32)
CREATININE: 0.6 mg/dL (ref 0.50–1.10)
GFR calc Af Amer: >90 mL/min (ref >90)
GFR calc non Af Amer: >90 mL/min (ref >90)
Glucose, Bld: 94 mg/dL (ref 70–99)
Potassium: 3.7 mEq/L (ref 3.7–5.3)
SODIUM: 143 meq/L (ref 137–147)
Total Bilirubin: <0.2 mg/dL — ABNORMAL LOW (ref 0.3–1.2)
Total Protein: 7.1 g/dL (ref 6.0–8.3)

## 2014-04-10 LAB — CBC WITH DIFFERENTIAL/PLATELET
BASOS ABS: 0 10*3/uL (ref 0.0–0.1)
BASOS PCT: 0 % (ref 0–1)
EOS PCT: 1 % (ref 0–5)
Eosinophils Absolute: 0 10*3/uL (ref 0.0–0.7)
HEMATOCRIT: 36.7 % (ref 36.0–46.0)
Hemoglobin: 11.6 g/dL — ABNORMAL LOW (ref 12.0–15.0)
Lymphocytes Relative: 47 % — ABNORMAL HIGH (ref 12–46)
Lymphs Abs: 2.6 10*3/uL (ref 0.7–4.0)
MCH: 27.6 pg (ref 26.0–34.0)
MCHC: 31.6 g/dL (ref 30.0–36.0)
MCV: 87.2 fL (ref 78.0–100.0)
MONO ABS: 0.4 10*3/uL (ref 0.1–1.0)
Monocytes Relative: 7 % (ref 3–12)
NEUTROS ABS: 2.5 10*3/uL (ref 1.7–7.7)
Neutrophils Relative %: 45 % (ref 43–77)
PLATELETS: 190 10*3/uL (ref 150–400)
RBC: 4.21 MIL/uL (ref 3.87–5.11)
RDW: 13 % (ref 11.5–15.5)
WBC: 5.5 10*3/uL (ref 4.0–10.5)

## 2014-04-10 LAB — PREGNANCY, URINE: PREG TEST UR: NEGATIVE

## 2014-04-10 LAB — WET PREP, GENITAL
TRICH WET PREP: NONE SEEN
WBC, Wet Prep HPF POC: NONE SEEN
Yeast Wet Prep HPF POC: NONE SEEN

## 2014-04-10 MED ORDER — OXYCODONE-ACETAMINOPHEN 5-325 MG PO TABS
2.0000 | ORAL_TABLET | Freq: Once | ORAL | Status: DC
  Filled 2014-04-10: qty 2

## 2014-04-10 MED ORDER — IBUPROFEN 800 MG PO TABS
800.0000 mg | ORAL_TABLET | Freq: Once | ORAL | Status: AC
  Administered 2014-04-10: 800 mg via ORAL
  Filled 2014-04-10: qty 1

## 2014-04-10 NOTE — ED Notes (Signed)
Pelvic cart already outside Pts door Per RN Sam

## 2014-04-10 NOTE — ED Provider Notes (Signed)
CSN: [id number]     Arrival date & time 04/10/14  2055 History   First MD Initiated Contact with Patient 04/10/14 2123     This chart was scribed for No att. providers found by Forrestine Him, ED Scribe. This patient was seen in room MH03/MH03 and the patient's care was started 12:45 PM.   Chief Complaint  Patient presents with  . Abdominal Pain   Patient is a 28 y.o. female presenting with abdominal pain.  Abdominal Pain Pain location:  Generalized Pain quality: sharp   Pain radiates to:  Does not radiate Pain severity:  Moderate Duration:  10 days Timing:  Sporadic Progression:  Unchanged Chronicity:  New Context: not recent illness and not sick contacts   Relieved by:  OTC medications Worsened by:  Nothing tried Associated symptoms: vaginal discharge and vomiting   Associated symptoms: no chest pain, no chills, no cough, no diarrhea, no dysuria, no fatigue, no fever, no hematuria, no nausea and no sore throat   Shortness of breath:    Severity:  Mild   Timing:  Intermittent Vaginal discharge:    Severity:  Mild Vomiting:    Severity:  Moderate   Timing:  Intermittent   Progression:  Unchanged   HPI Comments: Kayla Wilcox is a 28 y.o. female with a PMHx of asthma who presents to the Emergency Department complaining of intermittent, moderate abdominal pain x 10 days. She describes pain as sharp with each episode lasting 2-3 minutes; 5 episodes daily. No aggravating or alleviating factors. Pt also reports a HA, nausea, mild constipation, mild vaginal discharge, loss of appetite, mild SOB, fever (99.8 at  Its highest), and several episodes of vomiting. She has tried OTC Tylenol with mild improvement for some symptoms. No recent hematuria or diarrhea. Kayla Wilcox states she noted symptoms after her last normal menstrual period 10 days ago. Pt is an every day smoker. No recent sick contacts. No known allergies to medications.   Past Medical History  Diagnosis Date  . TMJ  (dislocation of temporomandibular joint)   . Asthma    Past Surgical History  Procedure Laterality Date  . Laparoscopic ovarian cystectomy     History reviewed. No pertinent family history. History  Substance Use Topics  . Smoking status: Current Every Day Smoker -- 0.50 packs/day    Types: Cigarettes  . Smokeless tobacco: Not on file  . Alcohol Use: No   OB History    No data available     Review of Systems  Constitutional: Negative for fever, chills, diaphoresis, activity change, appetite change and fatigue.  HENT: Negative for congestion, facial swelling, rhinorrhea and sore throat.   Eyes: Negative for photophobia and discharge.  Respiratory: Negative for cough and chest tightness.   Cardiovascular: Negative for chest pain, palpitations and leg swelling.  Gastrointestinal: Positive for vomiting and abdominal pain. Negative for nausea and diarrhea.  Endocrine: Negative for polydipsia and polyuria.  Genitourinary: Positive for vaginal discharge. Negative for dysuria, frequency, hematuria, difficulty urinating and pelvic pain.  Musculoskeletal: Negative for back pain, arthralgias, neck pain and neck stiffness.  Skin: Negative for color change and wound.  Allergic/Immunologic: Negative for immunocompromised state.  Neurological: Positive for headaches. Negative for facial asymmetry, weakness and numbness.  Hematological: Does not bruise/bleed easily.  Psychiatric/Behavioral: Negative for confusion and agitation.      Allergies  Review of patient's allergies indicates no known allergies.  Home Medications   Prior to Admission medications   Medication Sig Start Date End Date Taking?  Authorizing Provider  acetaminophen (TYLENOL) 500 MG tablet Take 500 mg by mouth every 6 (six) hours as needed.    Historical Provider, MD  HYDROcodone-acetaminophen (NORCO) 5-325 MG per tablet Take 1 tablet by mouth every 6 (six) hours as needed. 04/11/14   Ernestina Patches, MD  ibuprofen  (ADVIL,MOTRIN) 200 MG tablet Take 200 mg by mouth every 6 (six) hours as needed.    Historical Provider, MD  metroNIDAZOLE (FLAGYL) 500 MG tablet Take 1 tablet (500 mg total) by mouth 2 (two) times daily. One po bid x 7 days 02/18/14   Malvin Johns, MD  naproxen (NAPROSYN) 500 MG tablet Take 1 tablet (500 mg total) by mouth 2 (two) times daily. 12/12/13   April K Palumbo-Rasch, MD   Triage Vitals: BP 107/54 mmHg  Pulse 67  Temp(Src) 99.1 F (37.3 C) (Oral)  Resp 20  Ht 5\' 8"  (1.727 m)  Wt 276 lb (125.193 kg)  BMI 41.98 kg/m2  SpO2 98%  LMP 04/01/2014   Physical Exam  Constitutional: She is oriented to person, place, and time. She appears well-developed and well-nourished. No distress.  HENT:  Head: Normocephalic and atraumatic.  Mouth/Throat: No oropharyngeal exudate.  Eyes: Pupils are equal, round, and reactive to light.  Neck: Normal range of motion. Neck supple.  Cardiovascular: Normal rate, regular rhythm and normal heart sounds.  Exam reveals no gallop and no friction rub.   No murmur heard. Pulmonary/Chest: Effort normal and breath sounds normal. No respiratory distress. She has no wheezes. She has no rales.  Abdominal: Soft. Bowel sounds are normal. She exhibits no distension and no mass. There is tenderness in the suprapubic area. There is no rebound and no guarding.  Genitourinary: Uterus normal. Cervix exhibits motion tenderness. Cervix exhibits no discharge and no friability. Right adnexum displays no mass, no tenderness and no fullness. Left adnexum displays no mass, no tenderness and no fullness.  Musculoskeletal: Normal range of motion. She exhibits no edema or tenderness.  Neurological: She is alert and oriented to person, place, and time.  Skin: Skin is warm and dry.  Psychiatric: She has a normal mood and affect.    ED Course  Procedures (including critical care time)  DIAGNOSTIC STUDIES: Oxygen Saturation is 95% on RA, adequate by my interpretation.     COORDINATION OF CARE: 12:45 PM- Will order urinalysis, pregnancy urine, CBC, CMP, wet prep, and GC/Chlamydia. Discussed treatment plan with pt at bedside and pt agreed to plan.     Labs Review Labs Reviewed  WET PREP, GENITAL - Abnormal; Notable for the following:    Clue Cells Wet Prep HPF POC MODERATE (*)    All other components within normal limits  URINALYSIS, ROUTINE W REFLEX MICROSCOPIC - Abnormal; Notable for the following:    Color, Urine AMBER (*)    APPearance CLOUDY (*)    Specific Gravity, Urine 1.039 (*)    Bilirubin Urine SMALL (*)    Ketones, ur 15 (*)    All other components within normal limits  CBC WITH DIFFERENTIAL - Abnormal; Notable for the following:    Hemoglobin 11.6 (*)    Lymphocytes Relative 47 (*)    All other components within normal limits  COMPREHENSIVE METABOLIC PANEL - Abnormal; Notable for the following:    Total Bilirubin <0.2 (*)    All other components within normal limits  GC/CHLAMYDIA PROBE AMP  PREGNANCY, URINE    Imaging Review No results found.   EKG Interpretation None      MDM  Final diagnoses:  Lower abdominal pain    Pt is a 28 y.o. female with Pmhx as above who presents with intermittent migratory abdominal cramping for at least 10 days, pain lasting 2-3 mins at a time. On PE, VSS, pt in NAD. +suprapubic ttp w/o rebound ot guarding. Mild CMT w/o friability to significant d/c. CBC, CMP, UA grossly unremarkable. I do not feel clue cells are significant to symptoms. I doutb acute surgical cause of pain and feel pt safe to [facility] for continued symptoms.       I personally performed the services described in this documentation, which was scribed in my presence. The recorded information has been reviewed and is accurate.    Ernestina Patches, MD 04/11/14 1245

## 2014-04-10 NOTE — ED Notes (Signed)
Pt states that she haas had abd pain since last Tuesday, no changes she just needed to come in to get in checked.

## 2014-04-10 NOTE — ED Notes (Signed)
C/o abd cramping, ha, n/v x 1-3 weeks burning w urination at times,  Slight vag dc

## 2014-04-11 MED ORDER — HYDROCODONE-ACETAMINOPHEN 5-325 MG PO TABS: 1.0000 | ORAL_TABLET | Freq: Four times a day (QID) | ORAL | Status: DC | PRN

## 2014-04-11 NOTE — Discharge Instructions (Signed)
Abdominal Pain, Women °Abdominal (stomach, pelvic, or belly) pain can be caused by many things. It is important to tell your doctor: °· The location of the pain. °· Does it come and go or is it present all the time? °· Are there things that start the pain (eating certain foods, exercise)? °· Are there other symptoms associated with the pain (fever, nausea, vomiting, diarrhea)? °All of this is helpful to know when trying to find the cause of the pain. °CAUSES  °· Stomach: virus or bacteria infection, or ulcer. °· Intestine: appendicitis (inflamed appendix), regional ileitis (Crohn's disease), ulcerative colitis (inflamed colon), irritable bowel syndrome, diverticulitis (inflamed diverticulum of the colon), or cancer of the stomach or intestine. °· Gallbladder disease or stones in the gallbladder. °· Kidney disease, kidney stones, or infection. °· Pancreas infection or cancer. °· Fibromyalgia (pain disorder). °· Diseases of the female organs: °¨ Uterus: fibroid (non-cancerous) tumors or infection. °¨ Fallopian tubes: infection or tubal pregnancy. °¨ Ovary: cysts or tumors. °¨ Pelvic adhesions (scar tissue). °¨ Endometriosis (uterus lining tissue growing in the pelvis and on the pelvic organs). °¨ Pelvic congestion syndrome (female organs filling up with blood just before the menstrual period). °¨ Pain with the menstrual period. °¨ Pain with ovulation (producing an egg). °¨ Pain with an IUD (intrauterine device, birth control) in the uterus. °¨ Cancer of the female organs. °· Functional pain (pain not caused by a disease, may improve without treatment). °· Psychological pain. °· Depression. °DIAGNOSIS  °Your doctor will decide the seriousness of your pain by doing an examination. °· Blood tests. °· X-rays. °· Ultrasound. °· CT scan (computed tomography, special type of X-ray). °· MRI (magnetic resonance imaging). °· Cultures, for infection. °· Barium enema (dye inserted in the large intestine, to better view it with  X-rays). °· Colonoscopy (looking in intestine with a lighted tube). °· Laparoscopy (minor surgery, looking in abdomen with a lighted tube). °· Major abdominal exploratory surgery (looking in abdomen with a large incision). °TREATMENT  °The treatment will depend on the cause of the pain.  °· Many cases can be observed and treated at home. °· Over-the-counter medicines recommended by your caregiver. °· Prescription medicine. °· Antibiotics, for infection. °· Birth control pills, for painful periods or for ovulation pain. °· Hormone treatment, for endometriosis. °· Nerve blocking injections. °· Physical therapy. °· Antidepressants. °· Counseling with a psychologist or psychiatrist. °· Minor or major surgery. °HOME CARE INSTRUCTIONS  °· Do not take laxatives, unless directed by your caregiver. °· Take over-the-counter pain medicine only if ordered by your caregiver. Do not take aspirin because it can cause an upset stomach or bleeding. °· Try a clear liquid diet (broth or water) as ordered by your caregiver. Slowly move to a bland diet, as tolerated, if the pain is related to the stomach or intestine. °· Have a thermometer and take your temperature several times a day, and record it. °· Bed rest and sleep, if it helps the pain. °· Avoid sexual intercourse, if it causes pain. °· Avoid stressful situations. °· Keep your follow-up appointments and tests, as your caregiver orders. °· If the pain does not go away with medicine or surgery, you may try: °¨ Acupuncture. °¨ Relaxation exercises (yoga, meditation). °¨ Group therapy. °¨ Counseling. °SEEK MEDICAL CARE IF:  °· You notice certain foods cause stomach pain. °· Your home care treatment is not helping your pain. °· You need stronger pain medicine. °· You want your IUD removed. °· You feel faint or   lightheaded. °· You develop nausea and vomiting. °· You develop a rash. °· You are having side effects or an allergy to your medicine. °SEEK IMMEDIATE MEDICAL CARE IF:  °· Your  pain does not go away or gets worse. °· You have a fever. °· Your pain is felt only in portions of the abdomen. The right side could possibly be appendicitis. The left lower portion of the abdomen could be colitis or diverticulitis. °· You are passing blood in your stools (bright red or black tarry stools, with or without vomiting). °· You have blood in your urine. °· You develop chills, with or without a fever. °· You pass out. °MAKE SURE YOU:  °· Understand these instructions. °· Will watch your condition. °· Will get help right away if you are not doing well or get worse. °Document Released: 03/06/2007 Document Revised: 09/23/2013 Document Reviewed: 03/26/2009 °[employer]® Patient Information ©2015 [employer]. This information is not intended to replace advice given to you by your health care provider. Make sure you discuss any questions you have with your health care provider. ° °

## 2014-04-12 LAB — GC/CHLAMYDIA PROBE AMP
CT PROBE, AMP APTIMA: NEGATIVE
GC PROBE AMP APTIMA: NEGATIVE

## 2014-05-26 ENCOUNTER — Encounter (HOSPITAL_BASED_OUTPATIENT_CLINIC_OR_DEPARTMENT_OTHER): Payer: Self-pay

## 2014-05-26 ENCOUNTER — Emergency Department (HOSPITAL_BASED_OUTPATIENT_CLINIC_OR_DEPARTMENT_OTHER)
Admission: EM | Admit: 2014-05-26 | Discharge: 2014-05-26 | Disposition: A | Discharge status: Home / Self Care | Payer: Self-pay | Attending: Emergency Medicine | Admitting: Emergency Medicine

## 2014-05-26 DIAGNOSIS — Z720 Tobacco use: Secondary | ICD-10-CM | POA: Insufficient documentation

## 2014-05-26 DIAGNOSIS — J45909 Unspecified asthma, uncomplicated: Secondary | ICD-10-CM | POA: Insufficient documentation

## 2014-05-26 DIAGNOSIS — K0889 Other specified disorders of teeth and supporting structures: Secondary | ICD-10-CM

## 2014-05-26 DIAGNOSIS — K088 Other specified disorders of teeth and supporting structures: Secondary | ICD-10-CM | POA: Insufficient documentation

## 2014-05-26 DIAGNOSIS — Z791 Long term (current) use of non-steroidal anti-inflammatories (NSAID): Secondary | ICD-10-CM | POA: Insufficient documentation

## 2014-05-26 MED ORDER — BUPIVACAINE-EPINEPHRINE (PF) 0.5% -1:200000 IJ SOLN
1.8000 mL | Freq: Once | INTRAMUSCULAR | Status: DC
  Filled 2014-05-26: qty 1.8

## 2014-05-26 MED ORDER — PENICILLIN V POTASSIUM 500 MG PO TABS: 500.0000 mg | ORAL_TABLET | Freq: Four times a day (QID) | ORAL | Status: AC

## 2014-05-26 MED ORDER — IBUPROFEN 800 MG PO TABS: 800.0000 mg | ORAL_TABLET | Freq: Three times a day (TID) | ORAL | Status: DC

## 2014-05-26 NOTE — ED Provider Notes (Signed)
CSN: [id number]     Arrival date & time 05/26/14  1923 History   First MD Initiated Contact with Patient 05/26/14 2156     Chief Complaint  Patient presents with  . Dental Pain     (Consider location/radiation/quality/duration/timing/severity/associated sxs/prior Treatment) HPI Kayla Wilcox is a 29 year old female who presents the ER complaining of dental pain. Patient reports having a broken tooth on her upper left molars, which she reports has been painful starting today. Patient reports the pain has been constant, worse with eating, and radiates into her left ear. Patient denies any recent illness, swelling of her mouth, dysphagia, sore throat, nasal congestion, cough, shortness of breath, fever, nausea, vomiting.  Past Medical History  Diagnosis Date  . TMJ (dislocation of temporomandibular joint)   . Asthma    Past Surgical History  Procedure Laterality Date  . Laparoscopic ovarian cystectomy     No family history on file. History  Substance Use Topics  . Smoking status: Current Every Day Smoker -- 0.25 packs/day    Types: Cigarettes  . Smokeless tobacco: Not on file  . Alcohol Use: No   OB History    No data available     Review of Systems  Constitutional: Negative for fever.  HENT: Positive for dental problem.   Eyes: Negative for visual disturbance.  Respiratory: Negative for shortness of breath.   Cardiovascular: Negative for chest pain.  Gastrointestinal: Negative for nausea, vomiting and abdominal pain.  Genitourinary: Negative for dysuria.  Skin: Negative for rash.  Neurological: Negative for dizziness, syncope, weakness and numbness.  Psychiatric/Behavioral: Negative.       Allergies  Review of patient's allergies indicates no known allergies.  Home Medications   Prior to Admission medications   Medication Sig Start Date End Date Taking? Authorizing Provider  acetaminophen (TYLENOL) 500 MG tablet Take 500 mg by mouth every 6 (six) hours as needed.     Historical Provider, MD  HYDROcodone-acetaminophen (NORCO) 5-325 MG per tablet Take 1 tablet by mouth every 6 (six) hours as needed. 04/11/14   Ernestina Patches, MD  ibuprofen (ADVIL,MOTRIN) 800 MG tablet Take 1 tablet (800 mg total) by mouth 3 (three) times daily. 05/26/14   Carrie Mew, PA-C  metroNIDAZOLE (FLAGYL) 500 MG tablet Take 1 tablet (500 mg total) by mouth 2 (two) times daily. One po bid x 7 days 02/18/14   Malvin Johns, MD  naproxen (NAPROSYN) 500 MG tablet Take 1 tablet (500 mg total) by mouth 2 (two) times daily. 12/12/13   April K Palumbo-Rasch, MD  penicillin v potassium (VEETID) 500 MG tablet Take 1 tablet (500 mg total) by mouth 4 (four) times daily. 05/26/14 06/02/14  Carrie Mew, PA-C   BP 129/75 mmHg  Pulse 73  Temp(Src) 99 F (37.2 C) (Oral)  Resp 18  Ht 5\' 7"  (1.702 m)  Wt 260 lb (117.935 kg)  BMI 40.71 kg/m2  SpO2 100% Physical Exam  Constitutional: She is oriented to person, place, and time. She appears well-developed and well-nourished. No distress.  HENT:  Head: Normocephalic and atraumatic.  Broken tooth noted to second upper left premolar. No midline intact, gums nonerythematous, nonedematous. Roof and floor of mouth unremarkable. No signs of cellulitis or spread of infection. No gross dental abscess.  Eyes: Right eye exhibits no discharge. Left eye exhibits no discharge. No scleral icterus.  Neck: Normal range of motion.  Pulmonary/Chest: Effort normal. No respiratory distress.  Musculoskeletal: Normal range of motion.  Neurological: She is alert and oriented  to person, place, and time.  Skin: Skin is warm and dry. She is not diaphoretic.  Psychiatric: She has a normal mood and affect.  Nursing note and vitals reviewed.   ED Course  Procedures (including critical care time) Labs Review Labs Reviewed - No data to display  Imaging Review No results found.   EKG Interpretation None      MDM   Final diagnoses:  Pain, dental    Patient with  toothache.  No gross abscess.  Exam unconcerning for Ludwig's angina or spread of infection.  Will treat with penicillin and pain medicine.  Urged patient to follow-up with dentist.  I discussed return precautions with patient, and patient verbalizes agreement and understanding of this plan. I encouraged patient to call or return to the ER should she have any questions or concerns.  BP 129/75 mmHg  Pulse 73  Temp(Src) 99 F (37.2 C) (Oral)  Resp 18  Ht 5\' 7"  (1.702 m)  Wt 260 lb (117.935 kg)  BMI 40.71 kg/m2  SpO2 100%  Signed,  Dahlia Bailiff, PA-C 10:21 PM      Carrie Mew, PA-C 05/26/14 2221  Debby Freiberg, MD 05/27/14 [phone]

## 2014-05-26 NOTE — ED Notes (Signed)
Pt with dental pain, ear pain on L side of face, muscle spasms reported with ha.

## 2014-05-26 NOTE — ED Notes (Signed)
C/o left facial pain w 4 days  w bumps on side of face,  Doesn't know if it is tooth or ear pain,  States does have a bad tooth that when she has pain it usually goes away  But didn't this time

## 2014-05-26 NOTE — ED Notes (Signed)
Pt approached registration desk: angry  Stated 3 people had been taken back that came in after her.  I explained that we take the sickest pt first.  She then continued saying her tooth hurt and that made her just as sick as they were and she should have already gone back.  I went to the back ,where I was headed to start with, and ask the secretary to call security to help registration.

## 2014-05-26 NOTE — Discharge Instructions (Signed)

## 2014-07-23 ENCOUNTER — Encounter (HOSPITAL_BASED_OUTPATIENT_CLINIC_OR_DEPARTMENT_OTHER): Payer: Self-pay | Admitting: *Deleted

## 2014-07-23 ENCOUNTER — Emergency Department (HOSPITAL_BASED_OUTPATIENT_CLINIC_OR_DEPARTMENT_OTHER): Payer: Self-pay

## 2014-07-23 ENCOUNTER — Emergency Department (HOSPITAL_BASED_OUTPATIENT_CLINIC_OR_DEPARTMENT_OTHER)
Admission: EM | Admit: 2014-07-23 | Discharge: 2014-07-23 | Disposition: A | Discharge status: Home / Self Care | Payer: Self-pay | Attending: Emergency Medicine | Admitting: Emergency Medicine

## 2014-07-23 DIAGNOSIS — R1011 Right upper quadrant pain: Secondary | ICD-10-CM | POA: Insufficient documentation

## 2014-07-23 DIAGNOSIS — J069 Acute upper respiratory infection, unspecified: Secondary | ICD-10-CM | POA: Insufficient documentation

## 2014-07-23 DIAGNOSIS — R1012 Left upper quadrant pain: Secondary | ICD-10-CM | POA: Insufficient documentation

## 2014-07-23 DIAGNOSIS — R1013 Epigastric pain: Secondary | ICD-10-CM | POA: Insufficient documentation

## 2014-07-23 DIAGNOSIS — Z3202 Encounter for pregnancy test, result negative: Secondary | ICD-10-CM | POA: Insufficient documentation

## 2014-07-23 DIAGNOSIS — R040 Epistaxis: Secondary | ICD-10-CM | POA: Insufficient documentation

## 2014-07-23 DIAGNOSIS — Z720 Tobacco use: Secondary | ICD-10-CM | POA: Insufficient documentation

## 2014-07-23 DIAGNOSIS — J45909 Unspecified asthma, uncomplicated: Secondary | ICD-10-CM | POA: Insufficient documentation

## 2014-07-23 DIAGNOSIS — Z792 Long term (current) use of antibiotics: Secondary | ICD-10-CM | POA: Insufficient documentation

## 2014-07-23 DIAGNOSIS — E669 Obesity, unspecified: Secondary | ICD-10-CM | POA: Insufficient documentation

## 2014-07-23 DIAGNOSIS — Z791 Long term (current) use of non-steroidal anti-inflammatories (NSAID): Secondary | ICD-10-CM | POA: Insufficient documentation

## 2014-07-23 DIAGNOSIS — Z87828 Personal history of other (healed) physical injury and trauma: Secondary | ICD-10-CM | POA: Insufficient documentation

## 2014-07-23 LAB — COMPREHENSIVE METABOLIC PANEL
ALK PHOS: 56 U/L (ref 39–117)
ALT: 16 U/L (ref 0–35)
AST: 18 U/L (ref 0–37)
Albumin: 4 g/dL (ref 3.5–5.2)
Anion gap: 2 — ABNORMAL LOW (ref 5–15)
BILIRUBIN TOTAL: 0.3 mg/dL (ref 0.3–1.2)
BUN: 12 mg/dL (ref 6–23)
CHLORIDE: 112 mmol/L (ref 96–112)
CO2: 27 mmol/L (ref 19–32)
Calcium: 8.6 mg/dL (ref 8.4–10.5)
Creatinine, Ser: 0.5 mg/dL (ref 0.50–1.10)
GFR calc Af Amer: >90 mL/min (ref >90)
GFR calc non Af Amer: >90 mL/min (ref >90)
Glucose, Bld: 96 mg/dL (ref 70–99)
Potassium: 3.3 mmol/L — ABNORMAL LOW (ref 3.5–5.1)
SODIUM: 141 mmol/L (ref 135–145)
Total Protein: 7 g/dL (ref 6.0–8.3)

## 2014-07-23 LAB — CBC WITH DIFFERENTIAL/PLATELET
BASOS ABS: 0 10*3/uL (ref 0.0–0.1)
Basophils Relative: 0 % (ref 0–1)
EOS PCT: 1 % (ref 0–5)
Eosinophils Absolute: 0.1 10*3/uL (ref 0.0–0.7)
HCT: 36.6 % (ref 36.0–46.0)
Hemoglobin: 11.3 g/dL — ABNORMAL LOW (ref 12.0–15.0)
LYMPHS ABS: 1.7 10*3/uL (ref 0.7–4.0)
LYMPHS PCT: 32 % (ref 12–46)
MCH: 27 pg (ref 26.0–34.0)
MCHC: 30.9 g/dL (ref 30.0–36.0)
MCV: 87.6 fL (ref 78.0–100.0)
Monocytes Absolute: 0.5 10*3/uL (ref 0.1–1.0)
Monocytes Relative: 9 % (ref 3–12)
NEUTROS ABS: 2.9 10*3/uL (ref 1.7–7.7)
NEUTROS PCT: 58 % (ref 43–77)
PLATELETS: 173 10*3/uL (ref 150–400)
RBC: 4.18 MIL/uL (ref 3.87–5.11)
RDW: 13.6 % (ref 11.5–15.5)
WBC: 5.1 10*3/uL (ref 4.0–10.5)

## 2014-07-23 LAB — LIPASE, BLOOD: LIPASE: 28 U/L (ref 11–59)

## 2014-07-23 LAB — PREGNANCY, URINE: PREG TEST UR: NEGATIVE

## 2014-07-23 NOTE — ED Provider Notes (Signed)
CSN: [id number]     Arrival date & time 07/23/14  35 History   First MD Initiated Contact with Patient 07/23/14 1640     Chief Complaint  Patient presents with  . Epistaxis     (Consider location/radiation/quality/duration/timing/severity/associated sxs/prior Treatment) HPI  29 year old female presents with intermittent epistaxis for the past 5 days. These episodes last a couple minutes and then spontaneously resolved. She never has had apply significant pressure to her nose. She states she sometimes does feel lightheaded and dizzy. She denies any bleeding from her gums or blood in bowel movements. Last week after her menstrual cycle she did have a few days of spotting which is abnormal but that is resolved. She is currently having cough, congestion, and a "head cold" for the past 1 week. No fevers. No shortness of breath. She does have upper abdominal pain that she's is not sure if is coming from coughing or not. No vomiting. Denies any urinary symptoms. She took a pregnancy test a few days ago that was positive, but she states it took 20 minutes to turn positive and she's had false positives before from this store.  Past Medical History  Diagnosis Date  . TMJ (dislocation of temporomandibular joint)   . Asthma    Past Surgical History  Procedure Laterality Date  . Laparoscopic ovarian cystectomy     No family history on file. History  Substance Use Topics  . Smoking status: Current Every Day Smoker -- 0.25 packs/day    Types: Cigarettes  . Smokeless tobacco: Not on file  . Alcohol Use: Yes   OB History    No data available     Review of Systems  Constitutional: Negative for fever.  HENT: Positive for congestion and nosebleeds.   Respiratory: Positive for cough.   Gastrointestinal: Positive for abdominal pain. Negative for vomiting.  Genitourinary: Positive for menstrual problem. Negative for dysuria.  All other systems reviewed and are negative.     Allergies   Benadryl and Zofran  Home Medications   Prior to Admission medications   Medication Sig Start Date End Date Taking? Authorizing Provider  acetaminophen (TYLENOL) 500 MG tablet Take 500 mg by mouth every 6 (six) hours as needed.    Historical Provider, MD  HYDROcodone-acetaminophen (NORCO) 5-325 MG per tablet Take 1 tablet by mouth every 6 (six) hours as needed. 04/11/14   Ernestina Patches, MD  ibuprofen (ADVIL,MOTRIN) 800 MG tablet Take 1 tablet (800 mg total) by mouth 3 (three) times daily. 05/26/14   Carrie Mew, PA-C  metroNIDAZOLE (FLAGYL) 500 MG tablet Take 1 tablet (500 mg total) by mouth 2 (two) times daily. One po bid x 7 days 02/18/14   Malvin Johns, MD  naproxen (NAPROSYN) 500 MG tablet Take 1 tablet (500 mg total) by mouth 2 (two) times daily. 12/12/13   April K Palumbo-Rasch, MD   BP 123/71 mmHg  Pulse 70  Temp(Src) 98.5 F (36.9 C) (Oral)  Resp 18  Ht 5\' 7"  (1.702 m)  Wt 267 lb (121.11 kg)  BMI 41.81 kg/m2  SpO2 97% Physical Exam  Constitutional: She is oriented to person, place, and time. She appears well-developed and well-nourished.  obese  HENT:  Head: Normocephalic and atraumatic.  Right Ear: External ear normal.  Left Ear: External ear normal.  Nose: Nose normal. No nose lacerations, sinus tenderness, septal deviation or nasal septal hematoma. No epistaxis.  Eyes: Right eye exhibits no discharge. Left eye exhibits no discharge.  Cardiovascular: Normal rate, regular rhythm  and normal heart sounds.   Pulmonary/Chest: Effort normal and breath sounds normal.  Abdominal: Soft. There is tenderness in the right upper quadrant, epigastric area and left upper quadrant.  No lower abdominal tenderness  Neurological: She is alert and oriented to person, place, and time.  Skin: Skin is warm and dry.  Nursing note and vitals reviewed.   ED Course  Procedures (including critical care time) Labs Review Labs Reviewed  COMPREHENSIVE METABOLIC PANEL - Abnormal; Notable for  the following:    Potassium 3.3 (*)    Anion gap 2 (*)    All other components within normal limits  CBC WITH DIFFERENTIAL/PLATELET - Abnormal; Notable for the following:    Hemoglobin 11.3 (*)    All other components within normal limits  PREGNANCY, URINE  LIPASE, BLOOD  URINALYSIS, ROUTINE W REFLEX MICROSCOPIC    Imaging Review Dg Chest 2 View  07/23/2014   CLINICAL DATA:  Cough, asthma and epistaxis.  EXAM: CHEST - 2 VIEW  COMPARISON:  Chest x-ray from an abdominal series dated 02/18/2014.  FINDINGS: The heart size and mediastinal contours are within normal limits. There is no evidence of pulmonary edema, consolidation, pneumothorax, nodule or pleural fluid. The visualized skeletal structures are unremarkable.  IMPRESSION: No active disease.   Electronically Signed   By: Aletta Edouard M.D.   On: 07/23/2014 17:51     EKG Interpretation None      MDM   Final diagnoses:  Upper respiratory infection  Epistaxis    Patient had epistaxis right before coming into the ER but at this point there is no evidence of bleeding. I see no lesions or signs of trauma. Given her current URI symptoms this is likely related to her being dried out in her nares and will suggest over-the-counter remedies. X-ray shows no pneumonia. She is not pregnant based on our pregnancy test. She has mild upper abdominal pain I think is mostly due to coughing given her benign labs. No other signs of bleeding to suggest a systemic bleeding disorder. Counseled on how to stop nosebleed should they recur as well as importance of follow-up with PCP.    Ephraim Hamburger, MD 07/23/14 [id number]

## 2014-07-23 NOTE — ED Notes (Signed)
Pt reports nose bleed that started on Saturday. Pt reports intermittent nosebleeds lasting about 2-3 minutes.

## 2014-07-23 NOTE — ED Notes (Signed)
Pt also states that she also took a pregnancy test and it was positive, she has had some spotting since then

## 2014-07-23 NOTE — ED Notes (Signed)
MD at bedside. 

## 2016-06-12 ENCOUNTER — Encounter (HOSPITAL_BASED_OUTPATIENT_CLINIC_OR_DEPARTMENT_OTHER): Payer: Self-pay | Admitting: Emergency Medicine

## 2016-06-12 ENCOUNTER — Emergency Department (HOSPITAL_BASED_OUTPATIENT_CLINIC_OR_DEPARTMENT_OTHER)
Admission: EM | Admit: 2016-06-12 | Discharge: 2016-06-12 | Disposition: A | Discharge status: Home / Self Care | Payer: Self-pay | Attending: Emergency Medicine | Admitting: Emergency Medicine

## 2016-06-12 DIAGNOSIS — R112 Nausea with vomiting, unspecified: Secondary | ICD-10-CM | POA: Insufficient documentation

## 2016-06-12 DIAGNOSIS — R1084 Generalized abdominal pain: Secondary | ICD-10-CM | POA: Insufficient documentation

## 2016-06-12 DIAGNOSIS — J45909 Unspecified asthma, uncomplicated: Secondary | ICD-10-CM | POA: Insufficient documentation

## 2016-06-12 DIAGNOSIS — F17210 Nicotine dependence, cigarettes, uncomplicated: Secondary | ICD-10-CM | POA: Insufficient documentation

## 2016-06-12 DIAGNOSIS — R197 Diarrhea, unspecified: Secondary | ICD-10-CM | POA: Insufficient documentation

## 2016-06-12 LAB — COMPREHENSIVE METABOLIC PANEL
ALT: 37 U/L (ref 14–54)
AST: 19 U/L (ref 15–41)
Albumin: 4.2 g/dL (ref 3.5–5.0)
Alkaline Phosphatase: 45 U/L (ref 38–126)
Anion gap: 7 (ref 5–15)
BUN: 8 mg/dL (ref 6–20)
CHLORIDE: 109 mmol/L (ref 101–111)
CO2: 22 mmol/L (ref 22–32)
Calcium: 8.8 mg/dL — ABNORMAL LOW (ref 8.9–10.3)
Creatinine, Ser: 0.58 mg/dL (ref 0.44–1.00)
GFR calc Af Amer: >60 mL/min (ref >60)
GFR calc non Af Amer: >60 mL/min (ref >60)
Glucose, Bld: 109 mg/dL — ABNORMAL HIGH (ref 65–99)
Potassium: 3.3 mmol/L — ABNORMAL LOW (ref 3.5–5.1)
SODIUM: 138 mmol/L (ref 135–145)
Total Bilirubin: 0.6 mg/dL (ref 0.3–1.2)
Total Protein: 7.2 g/dL (ref 6.5–8.1)

## 2016-06-12 LAB — URINALYSIS, ROUTINE W REFLEX MICROSCOPIC
GLUCOSE, UA: NEGATIVE mg/dL
Hgb urine dipstick: NEGATIVE
Ketones, ur: NEGATIVE mg/dL
LEUKOCYTES UA: NEGATIVE
NITRITE: NEGATIVE
Protein, ur: NEGATIVE mg/dL
Specific Gravity, Urine: 1.027 (ref 1.005–1.030)
pH: 6 (ref 5.0–8.0)

## 2016-06-12 LAB — CBC
HEMATOCRIT: 43.2 % (ref 36.0–46.0)
Hemoglobin: 14.1 g/dL (ref 12.0–15.0)
MCH: 28.9 pg (ref 26.0–34.0)
MCHC: 32.6 g/dL (ref 30.0–36.0)
MCV: 88.5 fL (ref 78.0–100.0)
Platelets: 134 10*3/uL — ABNORMAL LOW (ref 150–400)
RBC: 4.88 MIL/uL (ref 3.87–5.11)
RDW: 12.6 % (ref 11.5–15.5)
WBC: 3.6 10*3/uL — ABNORMAL LOW (ref 4.0–10.5)

## 2016-06-12 LAB — PREGNANCY, URINE: PREG TEST UR: NEGATIVE

## 2016-06-12 LAB — LIPASE, BLOOD: Lipase: 18 U/L (ref 11–51)

## 2016-06-12 MED ORDER — LACTATED RINGERS IV BOLUS (SEPSIS)
1000.0000 mL | Freq: Once | INTRAVENOUS | Status: AC
  Administered 2016-06-12: 1000 mL via INTRAVENOUS

## 2016-06-12 MED ORDER — DICYCLOMINE HCL 10 MG PO CAPS
10.0000 mg | ORAL_CAPSULE | Freq: Once | ORAL | Status: AC
  Administered 2016-06-12: 10 mg via ORAL
  Filled 2016-06-12: qty 1

## 2016-06-12 MED ORDER — PROMETHAZINE HCL 25 MG PO TABS: 25.0000 mg | ORAL_TABLET | Freq: Three times a day (TID) | ORAL | 0 refills | Status: DC | PRN

## 2016-06-12 MED ORDER — PROMETHAZINE HCL 25 MG/ML IJ SOLN
25.0000 mg | Freq: Once | INTRAMUSCULAR | Status: AC
  Administered 2016-06-12: 25 mg via INTRAVENOUS
  Filled 2016-06-12: qty 1

## 2016-06-12 NOTE — ED Triage Notes (Addendum)
Patient sits down in chair and states " I know I am dehydrated because I have thrown up 8 times today and have not ate or drank nuthin" patient states that she is having N/V/D just today and fells tired. Patient also reports that her left side is hurting and she has a HA - reports that [facility] the STD check and she was told that she is malnourished.

## 2016-06-12 NOTE — ED Provider Notes (Signed)
[facility] Provider Note   CSN: [id number] Arrival date & time: 06/12/16  0044     History   Chief Complaint Chief Complaint  Patient presents with  . Abdominal Pain    HPI Kayla Wilcox is a 31 y.o. female.  The history is provided by the patient. No language interpreter was used.  Abdominal Pain      Kayla Wilcox is a 31 y.o. female who presents to the Emergency Department complaining of abdominal pain.  She reports generalized abdominal pain, nausea, vomiting, diarrhea. Symptoms started at 6 AM yesterday. She reports 8 episodes of emesis since yesterday. She had a fever to 102 at home. No dysuria, vaginal discharge. She has sick contacts but they have different symptoms. Symptoms are moderate and constant.  Past Medical History:  Diagnosis Date  . Asthma   . TMJ (dislocation of temporomandibular joint)     There are no active problems to display for this patient.   Past Surgical History:  Procedure Laterality Date  . LAPAROSCOPIC OVARIAN CYSTECTOMY      OB History    No data available       Home Medications    Prior to Admission medications   Medication Sig Start Date End Date Taking? Authorizing Provider  acetaminophen (TYLENOL) 500 MG tablet Take 500 mg by mouth every 6 (six) hours as needed.    Historical Provider, MD  HYDROcodone-acetaminophen (NORCO) 5-325 MG per tablet Take 1 tablet by mouth every 6 (six) hours as needed. 04/11/14   Ernestina Patches, MD  ibuprofen (ADVIL,MOTRIN) 800 MG tablet Take 1 tablet (800 mg total) by mouth 3 (three) times daily. 05/26/14   Dahlia Bailiff, PA-C  metroNIDAZOLE (FLAGYL) 500 MG tablet Take 1 tablet (500 mg total) by mouth 2 (two) times daily. One po bid x 7 days 02/18/14   Malvin Johns, MD  naproxen (NAPROSYN) 500 MG tablet Take 1 tablet (500 mg total) by mouth 2 (two) times daily. 12/12/13   April Palumbo, MD  promethazine (PHENERGAN) 25 MG tablet Take 1 tablet (25 mg total) by mouth every 8 (eight) hours as  needed for nausea or vomiting. 06/12/16   Quintella Reichert, MD    Family History History reviewed. No pertinent family history.  Social History Social History  Substance Use Topics  . Smoking status: Current Every Day Smoker    Packs/day: 0.25    Types: Cigarettes  . Smokeless tobacco: Never Used  . Alcohol use Yes     Allergies   Benadryl [diphenhydramine] and Zofran [ondansetron]   Review of Systems Review of Systems  Gastrointestinal: Positive for abdominal pain.  All other systems reviewed and are negative.    Physical Exam Updated Vital Signs BP 121/79 (BP Location: Right Arm)   Pulse (!) 57   Temp 98.5 F (36.9 C) (Oral)   Resp 20   Ht 5\' 7"  (1.702 m)   Wt 279 lb (126.6 kg)   LMP 05/09/2016   SpO2 100%   BMI 43.70 kg/m   Physical Exam  Constitutional: She is oriented to person, place, and time. She appears well-developed and well-nourished.  HENT:  Head: Normocephalic and atraumatic.  Cardiovascular: Normal rate and regular rhythm.   No murmur heard. Pulmonary/Chest: Effort normal and breath sounds normal. No respiratory distress.  Abdominal: Soft. There is no rebound and no guarding.  Mild diffuse abdominal tenderness.  Musculoskeletal: She exhibits no edema or tenderness.  Neurological: She is alert and oriented to person, place, and time.  Skin:  Skin is warm and dry.  Psychiatric: She has a normal mood and affect. Her behavior is normal.  Nursing note and vitals reviewed.    ED Treatments / Results  Labs (all labs ordered are listed, but only abnormal results are displayed) Labs Reviewed  URINALYSIS, ROUTINE W REFLEX MICROSCOPIC - Abnormal; Notable for the following:       Result Value   Color, Urine AMBER (*)    Bilirubin Urine SMALL (*)    All other components within normal limits  COMPREHENSIVE METABOLIC PANEL - Abnormal; Notable for the following:    Potassium 3.3 (*)    Glucose, Bld 109 (*)    Calcium 8.8 (*)    All other components  within normal limits  CBC - Abnormal; Notable for the following:    WBC 3.6 (*)    Platelets 134 (*)    All other components within normal limits  PREGNANCY, URINE  LIPASE, BLOOD    EKG  EKG Interpretation None       Radiology No results found.  Procedures Procedures (including critical care time)  Medications Ordered in ED Medications  lactated ringers bolus 1,000 mL (0 mLs Intravenous Stopped 06/12/16 0942)  promethazine (PHENERGAN) injection 25 mg (25 mg Intravenous Given 06/12/16 0743)  dicyclomine (BENTYL) capsule 10 mg (10 mg Oral Given 06/12/16 0744)     Initial Impression / Assessment and Plan / ED Course  I have reviewed the triage vital signs and the nursing notes.  Pertinent labs & imaging results that were available during my care of the patient were reviewed by me and considered in my medical decision making (see chart for details).     Patient here for evaluation of abdominal pain, nausea, vomiting, diarrhea, fevers at home. She is nontoxic appearing on examination with no distress. Following treatment in the emergency department with IV fluids and antiemetics she is feeling improved and able to tolerate oral fluids. Current clinical picture is not consistent with cholecystitis, appendicitis, perforated viscus. Counseled patient on home care for likely viral gastroenteritis. Discussed outpatient follow-up as well as close return precautions for repeat evaluation if she has any worsening or recurrent symptoms.  Final Clinical Impressions(s) / ED Diagnoses   Final diagnoses:  Nausea vomiting and diarrhea  Generalized abdominal pain    New Prescriptions Discharge Medication List as of 06/12/2016  9:38 AM    START taking these medications   Details  promethazine (PHENERGAN) 25 MG tablet Take 1 tablet (25 mg total) by mouth every 8 (eight) hours as needed for nausea or vomiting., Starting Sun 06/12/2016, Print         Quintella Reichert, MD 06/13/16 1153

## 2016-09-02 ENCOUNTER — Emergency Department (HOSPITAL_BASED_OUTPATIENT_CLINIC_OR_DEPARTMENT_OTHER)
Admission: EM | Admit: 2016-09-02 | Discharge: 2016-09-02 | Disposition: A | Discharge status: Home / Self Care | Payer: Self-pay | Attending: Emergency Medicine | Admitting: Emergency Medicine

## 2016-09-02 ENCOUNTER — Encounter (HOSPITAL_BASED_OUTPATIENT_CLINIC_OR_DEPARTMENT_OTHER): Payer: Self-pay | Admitting: *Deleted

## 2016-09-02 DIAGNOSIS — R1013 Epigastric pain: Secondary | ICD-10-CM | POA: Insufficient documentation

## 2016-09-02 DIAGNOSIS — J45909 Unspecified asthma, uncomplicated: Secondary | ICD-10-CM | POA: Insufficient documentation

## 2016-09-02 DIAGNOSIS — R1030 Lower abdominal pain, unspecified: Secondary | ICD-10-CM | POA: Insufficient documentation

## 2016-09-02 DIAGNOSIS — R109 Unspecified abdominal pain: Secondary | ICD-10-CM

## 2016-09-02 DIAGNOSIS — F17210 Nicotine dependence, cigarettes, uncomplicated: Secondary | ICD-10-CM | POA: Insufficient documentation

## 2016-09-02 LAB — CBC WITH DIFFERENTIAL/PLATELET
Basophils Absolute: 0 10*3/uL (ref 0.0–0.1)
Basophils Relative: 0 %
Eosinophils Absolute: 0.1 10*3/uL (ref 0.0–0.7)
Eosinophils Relative: 1 %
HEMATOCRIT: 37.8 % (ref 36.0–46.0)
HEMOGLOBIN: 12.2 g/dL (ref 12.0–15.0)
Lymphocytes Relative: 40 %
Lymphs Abs: 2.5 10*3/uL (ref 0.7–4.0)
MCH: 29.3 pg (ref 26.0–34.0)
MCHC: 32.3 g/dL (ref 30.0–36.0)
MCV: 90.9 fL (ref 78.0–100.0)
Monocytes Absolute: 0.5 10*3/uL (ref 0.1–1.0)
Monocytes Relative: 8 %
NEUTROS ABS: 3.1 10*3/uL (ref 1.7–7.7)
NEUTROS PCT: 51 %
Platelets: 188 10*3/uL (ref 150–400)
RBC: 4.16 MIL/uL (ref 3.87–5.11)
RDW: 12.9 % (ref 11.5–15.5)
WBC: 6.2 10*3/uL (ref 4.0–10.5)

## 2016-09-02 LAB — COMPREHENSIVE METABOLIC PANEL
ALBUMIN: 3.8 g/dL (ref 3.5–5.0)
ALT: 27 U/L (ref 14–54)
AST: 22 U/L (ref 15–41)
Alkaline Phosphatase: 44 U/L (ref 38–126)
Anion gap: 7 (ref 5–15)
BILIRUBIN TOTAL: 0.1 mg/dL — AB (ref 0.3–1.2)
BUN: 12 mg/dL (ref 6–20)
CO2: 26 mmol/L (ref 22–32)
CREATININE: 0.54 mg/dL (ref 0.44–1.00)
Calcium: 8.4 mg/dL — ABNORMAL LOW (ref 8.9–10.3)
Chloride: 108 mmol/L (ref 101–111)
GFR calc Af Amer: >60 mL/min (ref >60)
GLUCOSE: 90 mg/dL (ref 65–99)
Potassium: 3.3 mmol/L — ABNORMAL LOW (ref 3.5–5.1)
Sodium: 141 mmol/L (ref 135–145)
TOTAL PROTEIN: 6.4 g/dL — AB (ref 6.5–8.1)

## 2016-09-02 LAB — URINALYSIS, MICROSCOPIC (REFLEX)

## 2016-09-02 LAB — URINALYSIS, ROUTINE W REFLEX MICROSCOPIC
Bilirubin Urine: NEGATIVE
Glucose, UA: NEGATIVE mg/dL
Ketones, ur: NEGATIVE mg/dL
LEUKOCYTES UA: NEGATIVE
Nitrite: NEGATIVE
Protein, ur: NEGATIVE mg/dL
Specific Gravity, Urine: 1.031 — ABNORMAL HIGH (ref 1.005–1.030)
pH: 6 (ref 5.0–8.0)

## 2016-09-02 LAB — PREGNANCY, URINE: Preg Test, Ur: NEGATIVE

## 2016-09-02 LAB — LIPASE, BLOOD: LIPASE: 26 U/L (ref 11–51)

## 2016-09-02 MED ORDER — DICYCLOMINE HCL 20 MG PO TABS: 20.0000 mg | ORAL_TABLET | Freq: Four times a day (QID) | ORAL | 0 refills | Status: DC | PRN

## 2016-09-02 MED ORDER — PANTOPRAZOLE SODIUM 40 MG IV SOLR
40.0000 mg | Freq: Once | INTRAVENOUS | Status: AC
  Administered 2016-09-02: 40 mg via INTRAVENOUS
  Filled 2016-09-02: qty 40

## 2016-09-02 MED ORDER — DICYCLOMINE HCL 10 MG/ML IM SOLN
20.0000 mg | Freq: Once | INTRAMUSCULAR | Status: AC
  Administered 2016-09-02: 20 mg via INTRAMUSCULAR
  Filled 2016-09-02: qty 2

## 2016-09-02 MED ORDER — PROMETHAZINE HCL 25 MG PO TABS: 25.0000 mg | ORAL_TABLET | Freq: Four times a day (QID) | ORAL | 0 refills | Status: DC | PRN

## 2016-09-02 NOTE — ED Provider Notes (Signed)
[facility] Provider Note: Georgena Spurling, MD, FACEP  CSN: [id number] MRN: [id number] ARRIVAL: 09/02/16 at [place]: [place]  Abdominal Pain   HISTORY OF PRESENT ILLNESS  Kayla Wilcox is a 31 y.o. female with a two-week history of intermittent abdominal cramping. The cramping is located across the lower abdomen as well as the epigastrium. She has rated it as a 7 out of 10 at its worst. She has had intermittent nausea and vomiting as well as intermittent diarrhea over the same time period. She denies vaginal bleeding or discharge. She denies urinary symptoms. She has had multiple episodes of similar pain in the past which she has related to stress. She has been given a diagnosis of irritable bowel syndrome in the past and was previously on MiraLAX about 9 years ago but is not currently on any GI medication. She has not been able to see a primary care physician due to lack of insurance.   Past Medical History:  Diagnosis Date  . Asthma   . TMJ (dislocation of temporomandibular joint)     Past Surgical History:  Procedure Laterality Date  . LAPAROSCOPIC OVARIAN CYSTECTOMY      History reviewed. No pertinent family history.  Social History  Substance Use Topics  . Smoking status: Current Every Day Smoker    Packs/day: 0.25    Types: Cigarettes  . Smokeless tobacco: Never Used  . Alcohol use Yes    Prior to Admission medications   Medication Sig Start Date End Date Taking? Authorizing Provider  dicyclomine (BENTYL) 20 MG tablet Take 1 tablet (20 mg total) by mouth every 6 (six) hours as needed (for abdominal cramping). 09/02/16   Candy Ziegler, MD  promethazine (PHENERGAN) 25 MG tablet Take 1 tablet (25 mg total) by mouth every 6 (six) hours as needed for nausea or vomiting. 09/02/16   Shanon Rosser, MD    Allergies Benadryl [diphenhydramine] and Zofran [ondansetron]   REVIEW OF SYSTEMS  Negative except as noted here or in the History of  Present Illness.   PHYSICAL EXAMINATION  Initial Vital Signs Blood pressure 126/86, pulse 72, temperature 98.3 F (36.8 C), temperature source Oral, resp. rate 18, height 5\' 7"  (1.702 m), weight 280 lb (127 kg), SpO2 100 %.  Examination General: Well-developed, well-nourished female in no acute distress; appearance consistent with age of record HENT: normocephalic; atraumatic Eyes: pupils equal, round and reactive to light; extraocular muscles intact Neck: supple Heart: regular rate and rhythm Lungs: clear to auscultation bilaterally Abdomen: soft; nondistended; tenderness across the lower abdomen and in the epigastrium and less prominently in the left upper quadrant; no masses or hepatosplenomegaly; bowel sounds present Extremities: No deformity; full range of motion; pulses normal Neurologic: Awake, alert and oriented; motor function intact in all extremities and symmetric; no facial droop Skin: Warm and dry Psychiatric: Normal mood and affect   RESULTS  Summary of this visit's results, reviewed by myself:   EKG Interpretation  Date/Time:    Ventricular Rate:    PR Interval:    QRS Duration:   QT Interval:    QTC Calculation:   R Axis:     Text Interpretation:        Laboratory Studies: Results for orders placed or performed during the hospital encounter of 09/02/16 (from the past 24 hour(s))  Urinalysis, Routine w reflex microscopic     Status: Abnormal   Collection Time: 09/02/16  2:55 AM  Result Value Ref Range   Color,  Urine YELLOW YELLOW   APPearance CLOUDY (A) CLEAR   Specific Gravity, Urine 1.031 (H) 1.005 - 1.030   pH 6.0 5.0 - 8.0   Glucose, UA NEGATIVE NEGATIVE mg/dL   Hgb urine dipstick SMALL (A) NEGATIVE   Bilirubin Urine NEGATIVE NEGATIVE   Ketones, ur NEGATIVE NEGATIVE mg/dL   Protein, ur NEGATIVE NEGATIVE mg/dL   Nitrite NEGATIVE NEGATIVE   Leukocytes, UA NEGATIVE NEGATIVE  Pregnancy, urine     Status: None   Collection Time: 09/02/16  2:55 AM    Result Value Ref Range   Preg Test, Ur NEGATIVE NEGATIVE  CBC with Differential     Status: None   Collection Time: 09/02/16  2:55 AM  Result Value Ref Range   WBC 6.2 4.0 - 10.5 K/uL   RBC 4.16 3.87 - 5.11 MIL/uL   Hemoglobin 12.2 12.0 - 15.0 g/dL   HCT 37.8 36.0 - 46.0 %   MCV 90.9 78.0 - 100.0 fL   MCH 29.3 26.0 - 34.0 pg   MCHC 32.3 30.0 - 36.0 g/dL   RDW 12.9 11.5 - 15.5 %   Platelets 188 150 - 400 K/uL   Neutrophils Relative % 51 %   Neutro Abs 3.1 1.7 - 7.7 K/uL   Lymphocytes Relative 40 %   Lymphs Abs 2.5 0.7 - 4.0 K/uL   Monocytes Relative 8 %   Monocytes Absolute 0.5 0.1 - 1.0 K/uL   Eosinophils Relative 1 %   Eosinophils Absolute 0.1 0.0 - 0.7 K/uL   Basophils Relative 0 %   Basophils Absolute 0.0 0.0 - 0.1 K/uL  Urinalysis, Microscopic (reflex)     Status: Abnormal   Collection Time: 09/02/16  2:55 AM  Result Value Ref Range   RBC / HPF 0-5 0 - 5 RBC/hpf   WBC, UA 0-5 0 - 5 WBC/hpf   Bacteria, UA MANY (A) NONE SEEN   Squamous Epithelial / LPF 6-30 (A) NONE SEEN   Mucous PRESENT   Comprehensive metabolic panel     Status: Abnormal   Collection Time: 09/02/16  4:08 AM  Result Value Ref Range   Sodium 141 135 - 145 mmol/L   Potassium 3.3 (L) 3.5 - 5.1 mmol/L   Chloride 108 101 - 111 mmol/L   CO2 26 22 - 32 mmol/L   Glucose, Bld 90 65 - 99 mg/dL   BUN 12 6 - 20 mg/dL   Creatinine, Ser 0.54 0.44 - 1.00 mg/dL   Calcium 8.4 (L) 8.9 - 10.3 mg/dL   Total Protein 6.4 (L) 6.5 - 8.1 g/dL   Albumin 3.8 3.5 - 5.0 g/dL   AST 22 15 - 41 U/L   ALT 27 14 - 54 U/L   Alkaline Phosphatase 44 38 - 126 U/L   Total Bilirubin 0.1 (L) 0.3 - 1.2 mg/dL   GFR calc non Af Amer >60 >60 mL/min   GFR calc Af Amer >60 >60 mL/min   Anion gap 7 5 - 15  Lipase, blood     Status: None   Collection Time: 09/02/16  4:08 AM  Result Value Ref Range   Lipase 26 11 - 51 U/L   Imaging Studies: No results found.  ED COURSE  Nursing notes and initial vitals signs, including pulse  oximetry, reviewed.  Vitals:   09/02/16 0246 09/02/16 0251  BP: 126/86 126/86  Pulse: 72 72  Resp: 20 18  Temp: 98.3 F (36.8 C) 98.3 F (36.8 C)  TempSrc: Oral Oral  SpO2: 100% 100%  Weight: 280 lb (127 kg)   Height: 5\' 7"  (1.702 m)    The patient declined a CT scan stating she has had multiple CT scans the past without any diagnostic findings. We will treat her symptomatically and refer to gastroenterology. I suspect this represents irritable bowel syndrome than his long-standing history and relationship distress.  PROCEDURES    ED DIAGNOSES     ICD-9-CM ICD-10-CM   1. Abdominal cramping 789.00 R10.9        Shanon Rosser, MD 09/02/16 [phone]

## 2016-12-10 ENCOUNTER — Emergency Department (HOSPITAL_BASED_OUTPATIENT_CLINIC_OR_DEPARTMENT_OTHER)
Admission: EM | Admit: 2016-12-10 | Discharge: 2016-12-10 | Disposition: A | Discharge status: Home / Self Care | Payer: Self-pay | Attending: Emergency Medicine | Admitting: Emergency Medicine

## 2016-12-10 ENCOUNTER — Encounter (HOSPITAL_BASED_OUTPATIENT_CLINIC_OR_DEPARTMENT_OTHER): Payer: Self-pay | Admitting: Emergency Medicine

## 2016-12-10 DIAGNOSIS — J45909 Unspecified asthma, uncomplicated: Secondary | ICD-10-CM | POA: Insufficient documentation

## 2016-12-10 DIAGNOSIS — F17210 Nicotine dependence, cigarettes, uncomplicated: Secondary | ICD-10-CM | POA: Insufficient documentation

## 2016-12-10 DIAGNOSIS — R6884 Jaw pain: Secondary | ICD-10-CM | POA: Insufficient documentation

## 2016-12-10 DIAGNOSIS — W57XXXA Bitten or stung by nonvenomous insect and other nonvenomous arthropods, initial encounter: Secondary | ICD-10-CM | POA: Insufficient documentation

## 2016-12-10 DIAGNOSIS — L539 Erythematous condition, unspecified: Secondary | ICD-10-CM | POA: Insufficient documentation

## 2016-12-10 DIAGNOSIS — Z859 Personal history of malignant neoplasm, unspecified: Secondary | ICD-10-CM | POA: Insufficient documentation

## 2016-12-10 HISTORY — DX: Malignant (primary) neoplasm, unspecified: C80.1

## 2016-12-10 MED ORDER — PERMETHRIN 5 % EX CREA: TOPICAL_CREAM | CUTANEOUS | 1 refills | Status: DC

## 2016-12-10 MED ORDER — CYCLOBENZAPRINE HCL 5 MG PO TABS: 5.0000 mg | ORAL_TABLET | Freq: Three times a day (TID) | ORAL | 0 refills | Status: DC | PRN

## 2016-12-10 NOTE — ED Notes (Addendum)
Patient reports left sided facial swelling for 4 years, states that she has a bad tooth "it feels like it's falling out". Patient reports being bit by a bug behind her left ear today around 2:30pm, skin is smooth and flat, no redness noted where patient reports bite occuring. Patient states that it made her feel very sleepy. Patient also would like her right leg evaluated for spots.   Patient does not have a dentist, and has not sought out treatment for her dental pain due to lack of insurance.

## 2016-12-10 NOTE — Discharge Instructions (Signed)
Take the prescribed medication as directed. Follow-up with dentist.  See attached paperwork for list of clinics. Return to the ED for new or worsening symptoms.

## 2016-12-10 NOTE — ED Provider Notes (Signed)
[facility] Provider Note   CSN: [id number] Arrival date & time: 12/10/16  1720     History   Chief Complaint Chief Complaint  Patient presents with  . Jaw Pain    HPI Kitana Constable is a 31 y.o. female.  The history is provided by the patient and medical records.   31 year old female with history of asthma, TMJ, presenting to the ED with multiple complaints.  1.  Jaw pain-- has been ongoing for 4 years now. States she has broken left lower molar for several months now. She does not currently have any insurance and has not been able to see dentist. She denies any facial swelling or fever. No difficulty eating or drinking.  2.  Bug bites-- states she got bit behind the left ear a few days ago and it has been itching. She is also noticed these "spots" on her legs and arms. States she feels they are small bug bites to. She reports there was some purulent drainage a few days ago, none currently. States now they are "scabbed over". She works [employer] and reports she has been around people with known scabies. She's not tried any medications for her symptoms. States she cannot take Benadryl as it makes her itch.  Past Medical History:  Diagnosis Date  . Asthma   . Cancer ([place])   . TMJ (dislocation of temporomandibular joint)     There are no active problems to display for this patient.   Past Surgical History:  Procedure Laterality Date  . LAPAROSCOPIC OVARIAN CYSTECTOMY      OB History    No data available       Home Medications    Prior to Admission medications   Medication Sig Start Date End Date Taking? Authorizing Provider  dicyclomine (BENTYL) 20 MG tablet Take 1 tablet (20 mg total) by mouth every 6 (six) hours as needed (for abdominal cramping). 09/02/16   Molpus, Jenny Reichmann, MD  promethazine (PHENERGAN) 25 MG tablet Take 1 tablet (25 mg total) by mouth every 6 (six) hours as needed for nausea or vomiting. 09/02/16   Molpus, Jenny Reichmann, MD    Family History No  family history on file.  Social History Social History  Substance Use Topics  . Smoking status: Current Every Day Smoker    Packs/day: 0.25    Types: Cigarettes  . Smokeless tobacco: Never Used  . Alcohol use Yes     Allergies   Benadryl [diphenhydramine] and Zofran [ondansetron]   Review of Systems Review of Systems  HENT: Positive for dental problem.   Skin:       Bug bites  All other systems reviewed and are negative.    Physical Exam Updated Vital Signs BP (!) 141/92 (BP Location: Left Arm)   Pulse 64   Temp 98.6 F (37 C) (Oral)   Resp 18   Ht 5\' 10"  (1.778 m)   Wt 127 kg (280 lb)   LMP 11/29/2016   SpO2 98%   BMI 40.18 kg/m   Physical Exam  Constitutional: She is oriented to person, place, and time. She appears well-developed and well-nourished.  HENT:  Head: Normocephalic and atraumatic.  Mouth/Throat: Oropharynx is clear and moist.  Teeth largely in poor dentition, left lower second molar broken along posterior aspect, surrounding gingiva normal in appearance without signs of abscess formation, handling secretions appropriately, no trismus, no facial or neck swelling, normal phonation without stridor No bug bites noted behind the left ear, patient actively scratching on exam  Eyes: Pupils are equal, round, and reactive to light. Conjunctivae and EOM are normal.  Neck: Normal range of motion.  Cardiovascular: Normal rate, regular rhythm and normal heart sounds.   Pulmonary/Chest: Effort normal and breath sounds normal.  Abdominal: Soft. Bowel sounds are normal.  Musculoskeletal: Normal range of motion.  Multiple scabbed over areas of the lower legs, no surrounding erythema or induration, no drainage, no signs of cellulitis, no swelling  Neurological: She is alert and oriented to person, place, and time.  Skin: Skin is warm and dry.  Psychiatric: She has a normal mood and affect.  Nursing note and vitals reviewed.    ED Treatments / Results   Labs (all labs ordered are listed, but only abnormal results are displayed) Labs Reviewed - No data to display  EKG  EKG Interpretation None       Radiology No results found.  Procedures Procedures (including critical care time)  Medications Ordered in ED Medications - No data to display   Initial Impression / Assessment and Plan / ED Course  I have reviewed the triage vital signs and the nursing notes.  Pertinent labs & imaging results that were available during my care of the patient were reviewed by me and considered in my medical decision making (see chart for details).  31 year old female here with multiple complaints.  1.  Jaw pain-- has been ongoing for 4 years due to TMJ. She has a broken left lower second molar which is also old. She has no signs of dental infection or abscess on exam today. Reports she has had relief with Flexeril the past.  Script given for this, referred to dental clinics for follow-up.  2.  Bug bites-- reports known bug bite recently as well as exposure to someone with scabies.  Has concern for such, will treat with permithrin.  Can follow-up with PCP for these issues.  Discussed plan with patient, she acknowledged understanding and agreed with plan of care.  Return precautions given for new or worsening symptoms.  Final Clinical Impressions(s) / ED Diagnoses   Final diagnoses:  Bug bite, initial encounter  Jaw pain    New Prescriptions New Prescriptions   No medications on file     Kathryne Hitch 12/10/16 2009    Varney Biles, MD 12/10/16 [phone]

## 2016-12-10 NOTE — ED Triage Notes (Signed)
L jaw pain and swelling. Pt states she has a bad tooth on the bottom L and also got bit by a bug behind the L ear.

## 2017-01-12 ENCOUNTER — Emergency Department (HOSPITAL_BASED_OUTPATIENT_CLINIC_OR_DEPARTMENT_OTHER)
Admission: EM | Admit: 2017-01-12 | Discharge: 2017-01-13 | Disposition: A | Discharge status: Home / Self Care | Payer: Self-pay | Attending: Emergency Medicine | Admitting: Emergency Medicine

## 2017-01-12 ENCOUNTER — Encounter (HOSPITAL_BASED_OUTPATIENT_CLINIC_OR_DEPARTMENT_OTHER): Payer: Self-pay | Admitting: *Deleted

## 2017-01-12 DIAGNOSIS — F17210 Nicotine dependence, cigarettes, uncomplicated: Secondary | ICD-10-CM | POA: Insufficient documentation

## 2017-01-12 DIAGNOSIS — J45909 Unspecified asthma, uncomplicated: Secondary | ICD-10-CM | POA: Insufficient documentation

## 2017-01-12 DIAGNOSIS — Z202 Contact with and (suspected) exposure to infections with a predominantly sexual mode of transmission: Secondary | ICD-10-CM | POA: Insufficient documentation
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
## 2017-01-13 ENCOUNTER — Encounter (HOSPITAL_BASED_OUTPATIENT_CLINIC_OR_DEPARTMENT_OTHER): Payer: Self-pay | Admitting: Emergency Medicine

## 2017-01-13 LAB — BASIC METABOLIC PANEL
Anion gap: 9 (ref 5–15)
BUN: 9 mg/dL (ref 6–20)
CALCIUM: 8.7 mg/dL — AB (ref 8.9–10.3)
CO2: 24 mmol/L (ref 22–32)
CREATININE: 0.58 mg/dL (ref 0.44–1.00)
Chloride: 106 mmol/L (ref 101–111)
GFR calc Af Amer: >60 mL/min (ref >60)
GLUCOSE: 87 mg/dL (ref 65–99)
Potassium: 3.3 mmol/L — ABNORMAL LOW (ref 3.5–5.1)
Sodium: 139 mmol/L (ref 135–145)

## 2017-01-13 LAB — CBC WITH DIFFERENTIAL/PLATELET
Basophils Absolute: 0 10*3/uL (ref 0.0–0.1)
Basophils Relative: 0 %
EOS PCT: 0 %
Eosinophils Absolute: 0 10*3/uL (ref 0.0–0.7)
HEMATOCRIT: 39.1 % (ref 36.0–46.0)
Hemoglobin: 12.6 g/dL (ref 12.0–15.0)
LYMPHS PCT: 20 %
Lymphs Abs: 1.3 10*3/uL (ref 0.7–4.0)
MCH: 28.8 pg (ref 26.0–34.0)
MCHC: 32.2 g/dL (ref 30.0–36.0)
MCV: 89.5 fL (ref 78.0–100.0)
MONO ABS: 0.5 10*3/uL (ref 0.1–1.0)
MONOS PCT: 8 %
NEUTROS ABS: 4.6 10*3/uL (ref 1.7–7.7)
Neutrophils Relative %: 72 %
PLATELETS: 165 10*3/uL (ref 150–400)
RBC: 4.37 MIL/uL (ref 3.87–5.11)
RDW: 13.6 % (ref 11.5–15.5)
WBC: 6.4 10*3/uL (ref 4.0–10.5)

## 2017-01-13 LAB — URINALYSIS, ROUTINE W REFLEX MICROSCOPIC
Bilirubin Urine: NEGATIVE
Glucose, UA: NEGATIVE mg/dL
HGB URINE DIPSTICK: NEGATIVE
Ketones, ur: NEGATIVE mg/dL
LEUKOCYTES UA: NEGATIVE
Nitrite: NEGATIVE
PH: 6 (ref 5.0–8.0)
PROTEIN: NEGATIVE mg/dL
Specific Gravity, Urine: 1.023 (ref 1.005–1.030)

## 2017-01-13 LAB — RAPID HIV SCREEN (HIV 1/2 AB+AG)
HIV 1/2 Antibodies: NONREACTIVE
HIV-1 P24 Antigen - HIV24: NONREACTIVE

## 2017-01-13 LAB — PREGNANCY, URINE: PREG TEST UR: NEGATIVE

## 2017-01-13 MED ORDER — EMTRICITABINE-TENOFOVIR DF 200-300 MG PO TABS: 1.0000 | ORAL_TABLET | Freq: Every day | ORAL | 0 refills | Status: AC

## 2017-01-13 MED ORDER — RALTEGRAVIR POTASSIUM 400 MG PO TABS: 400.0000 mg | ORAL_TABLET | Freq: Two times a day (BID) | ORAL | 0 refills | Status: AC

## 2017-01-13 NOTE — ED Notes (Signed)
Pt denies any symptoms, states she had STI testing 3-4 months ago and was negative, but has had unprotected sex since then prior to last night.  Advised pt that testing may not be accurate with an exposure in the last 24 hours and that she will need to follow up with the health department.
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
## 2017-01-13 NOTE — ED Notes (Signed)
Updated pt to the wait on consulting provider

## 2017-01-13 NOTE — ED Notes (Signed)
Pt verbalizes understanding of d/c instructions and denies any further needs at this time. 

## 2020-06-13 ENCOUNTER — Other Ambulatory Visit: Payer: Self-pay

## 2020-06-13 ENCOUNTER — Encounter (HOSPITAL_BASED_OUTPATIENT_CLINIC_OR_DEPARTMENT_OTHER): Payer: Self-pay | Admitting: Emergency Medicine

## 2020-06-13 DIAGNOSIS — R110 Nausea: Secondary | ICD-10-CM | POA: Insufficient documentation

## 2020-06-13 DIAGNOSIS — Z20822 Contact with and (suspected) exposure to covid-19: Secondary | ICD-10-CM | POA: Insufficient documentation

## 2020-06-13 DIAGNOSIS — R1084 Generalized abdominal pain: Secondary | ICD-10-CM | POA: Insufficient documentation

## 2020-06-13 DIAGNOSIS — R059 Cough, unspecified: Secondary | ICD-10-CM | POA: Insufficient documentation

## 2020-06-13 DIAGNOSIS — Z859 Personal history of malignant neoplasm, unspecified: Secondary | ICD-10-CM | POA: Insufficient documentation

## 2020-06-13 DIAGNOSIS — J45909 Unspecified asthma, uncomplicated: Secondary | ICD-10-CM | POA: Insufficient documentation

## 2020-06-13 DIAGNOSIS — F17210 Nicotine dependence, cigarettes, uncomplicated: Secondary | ICD-10-CM | POA: Insufficient documentation

## 2020-06-13 NOTE — ED Triage Notes (Signed)
Reports having lower and upper abdominal pain for the last week. Also reports n/v/d as well as feeling dizzy or faint.  Also reports tooth infection a few weeks ago.  Also reports body aches and using inhaler more often.  Hx of asthma.

## 2020-06-14 ENCOUNTER — Emergency Department (HOSPITAL_BASED_OUTPATIENT_CLINIC_OR_DEPARTMENT_OTHER)
Admission: EM | Admit: 2020-06-14 | Discharge: 2020-06-14 | Disposition: A | Discharge status: Home / Self Care | Payer: Self-pay | Attending: Emergency Medicine | Admitting: Emergency Medicine

## 2020-06-14 ENCOUNTER — Encounter (HOSPITAL_BASED_OUTPATIENT_CLINIC_OR_DEPARTMENT_OTHER): Payer: Self-pay | Admitting: Emergency Medicine

## 2020-06-14 DIAGNOSIS — Z20822 Contact with and (suspected) exposure to covid-19: Secondary | ICD-10-CM

## 2020-06-14 LAB — COMPREHENSIVE METABOLIC PANEL
ALT: 29 U/L (ref 0–44)
AST: 19 U/L (ref 15–41)
Albumin: 4.2 g/dL (ref 3.5–5.0)
Alkaline Phosphatase: 56 U/L (ref 38–126)
Anion gap: 10 (ref 5–15)
BUN: 8 mg/dL (ref 6–20)
CO2: 23 mmol/L (ref 22–32)
Calcium: 8.8 mg/dL — ABNORMAL LOW (ref 8.9–10.3)
Chloride: 106 mmol/L (ref 98–111)
Creatinine, Ser: 0.58 mg/dL (ref 0.44–1.00)
GFR, Estimated: >60 mL/min (ref >60)
Glucose, Bld: 93 mg/dL (ref 70–99)
Potassium: 3.3 mmol/L — ABNORMAL LOW (ref 3.5–5.1)
Sodium: 139 mmol/L (ref 135–145)
Total Bilirubin: 0.5 mg/dL (ref 0.3–1.2)
Total Protein: 7.4 g/dL (ref 6.5–8.1)

## 2020-06-14 LAB — CBC
HCT: 41.4 % (ref 36.0–46.0)
Hemoglobin: 12.8 g/dL (ref 12.0–15.0)
MCH: 28.1 pg (ref 26.0–34.0)
MCHC: 30.9 g/dL (ref 30.0–36.0)
MCV: 91 fL (ref 80.0–100.0)
Platelets: 228 10*3/uL (ref 150–400)
RBC: 4.55 MIL/uL (ref 3.87–5.11)
RDW: 13.1 % (ref 11.5–15.5)
WBC: 4.9 10*3/uL (ref 4.0–10.5)
nRBC: 0 % (ref 0.0–0.2)

## 2020-06-14 LAB — URINALYSIS, ROUTINE W REFLEX MICROSCOPIC
Bilirubin Urine: NEGATIVE
Glucose, UA: NEGATIVE mg/dL
Hgb urine dipstick: NEGATIVE
Ketones, ur: 15 mg/dL — AB
Leukocytes,Ua: NEGATIVE
Nitrite: NEGATIVE
Protein, ur: NEGATIVE mg/dL
Specific Gravity, Urine: 1.03 (ref 1.005–1.030)
pH: 6 (ref 5.0–8.0)

## 2020-06-14 LAB — SARS CORONAVIRUS 2 (TAT 6-24 HRS): SARS Coronavirus 2: NEGATIVE

## 2020-06-14 LAB — PREGNANCY, URINE: Preg Test, Ur: NEGATIVE

## 2020-06-14 MED ORDER — ALUM & MAG HYDROXIDE-SIMETH 200-200-20 MG/5ML PO SUSP
30.0000 mL | Freq: Once | ORAL | Status: AC
  Administered 2020-06-14: 30 mL via ORAL
  Filled 2020-06-14: qty 30

## 2020-06-14 NOTE — ED Notes (Signed)
For the past week states been having abd pain, nausea often and some vomiting, moving around makes nausea worse, appetite is poor as well.

## 2020-06-14 NOTE — ED Provider Notes (Signed)
[facility] EMERGENCY DEPARTMENT Provider Note   CSN: [id number] Arrival date & time: 06/13/20  2340     History Chief Complaint  Patient presents with  . Abdominal Pain    Kayla Wilcox is a 35 y.o. female.  The history is provided by the patient.  Abdominal Pain Pain location:  Generalized Pain quality: cramping   Pain radiates to:  Does not radiate Pain severity:  Moderate Onset quality:  Gradual Duration:  2 days Timing:  Constant Progression:  Waxing and waning Context: not suspicious food intake   Relieved by:  Nothing Worsened by:  Nothing Ineffective treatments:  None tried Associated symptoms: cough and nausea   Associated symptoms: no diarrhea and no fever   Associated symptoms comment:  Body aches  Risk factors: no alcohol abuse        Past Medical History:  Diagnosis Date  . Asthma   . Cancer ([place])   . TMJ (dislocation of temporomandibular joint)     There are no problems to display for this patient.   Past Surgical History:  Procedure Laterality Date  . LAPAROSCOPIC OVARIAN CYSTECTOMY       OB History   No obstetric history on file.     History reviewed. No pertinent family history.  Social History   Tobacco Use  . Smoking status: Current Every Day Smoker    Packs/day: 0.25    Types: Cigarettes  . Smokeless tobacco: Never Used  Substance Use Topics  . Alcohol use: Yes  . Drug use: Yes    Types: Marijuana    Home Medications Prior to Admission medications   Medication Sig Start Date End Date Taking? Authorizing Provider  emtricitabine-tenofovir (TRUVADA) 200-300 MG tablet Take 1 tablet by mouth daily. 01/13/17   Kayla Flight, MD  raltegravir (ISENTRESS) 400 MG tablet Take 1 tablet (400 mg total) by mouth 2 (two) times daily. 01/13/17   Kayla Imperato, MD    Allergies    Benadryl [diphenhydramine] and Zofran [ondansetron]  Review of Systems   Review of Systems  Constitutional: Negative for fever.  HENT:  Negative for congestion.   Eyes: Negative for visual disturbance.  Respiratory: Positive for cough.   Gastrointestinal: Positive for abdominal pain and nausea. Negative for diarrhea.  Genitourinary: Negative for difficulty urinating.  Musculoskeletal: Positive for myalgias.  Skin: Negative for rash.  Neurological: Negative for dizziness.  Psychiatric/Behavioral: Negative for agitation.  All other systems reviewed and are negative.   Physical Exam Updated Vital Signs BP 123/77 (BP Location: Right Arm)   Pulse 83   Temp 98.7 F (37.1 C) (Oral)   Resp 20   Ht 5\' 8"  (1.727 m)   Wt 121.2 kg   SpO2 100%   BMI 40.63 kg/m   Physical Exam  ED Results / Procedures / Treatments   Labs (all labs ordered are listed, but only abnormal results are displayed) Results for orders placed or performed during the hospital encounter of 06/14/20  Comprehensive metabolic panel  Result Value Ref Range   Sodium 139 135 - 145 mmol/L   Potassium 3.3 (L) 3.5 - 5.1 mmol/L   Chloride 106 98 - 111 mmol/L   CO2 23 22 - 32 mmol/L   Glucose, Bld 93 70 - 99 mg/dL   BUN 8 6 - 20 mg/dL   Creatinine, Ser 0.58 0.44 - 1.00 mg/dL   Calcium 8.8 (L) 8.9 - 10.3 mg/dL   Total Protein 7.4 6.5 - 8.1 g/dL   Albumin 4.2 3.5 -  5.0 g/dL   AST 19 15 - 41 U/L   ALT 29 0 - 44 U/L   Alkaline Phosphatase 56 38 - 126 U/L   Total Bilirubin 0.5 0.3 - 1.2 mg/dL   GFR, Estimated >60 >60 mL/min   Anion gap 10 5 - 15  CBC  Result Value Ref Range   WBC 4.9 4.0 - 10.5 K/uL   RBC 4.55 3.87 - 5.11 MIL/uL   Hemoglobin 12.8 12.0 - 15.0 g/dL   HCT 41.4 36.0 - 46.0 %   MCV 91.0 80.0 - 100.0 fL   MCH 28.1 26.0 - 34.0 pg   MCHC 30.9 30.0 - 36.0 g/dL   RDW 13.1 11.5 - 15.5 %   Platelets 228 150 - 400 K/uL   nRBC 0.0 0.0 - 0.2 %  Urinalysis, Routine w reflex microscopic Urine, Clean Catch  Result Value Ref Range   Color, Urine YELLOW YELLOW   APPearance HAZY (A) CLEAR   Specific Gravity, Urine 1.030 1.005 - 1.030   pH 6.0 5.0  - 8.0   Glucose, UA NEGATIVE NEGATIVE mg/dL   Hgb urine dipstick NEGATIVE NEGATIVE   Bilirubin Urine NEGATIVE NEGATIVE   Ketones, ur 15 (A) NEGATIVE mg/dL   Protein, ur NEGATIVE NEGATIVE mg/dL   Nitrite NEGATIVE NEGATIVE   Leukocytes,Ua NEGATIVE NEGATIVE  Pregnancy, urine  Result Value Ref Range   Preg Test, Ur NEGATIVE NEGATIVE   No results found.  EKG EKG Interpretation  Date/Time:  Saturday June 13 2020 23:59:05 EST Ventricular Rate:  61 PR Interval:  158 QRS Duration: 80 QT Interval:  386 QTC Calculation: 388 R Axis:   73 Text Interpretation: Normal sinus rhythm Confirmed by Kayla Wilcox, Kayla Wilcox ([id number]) on 06/14/2020 12:08:34 AM   Radiology No results found.  Procedures Procedures (including critical care time)  Medications Ordered in ED Medications  alum & mag hydroxide-simeth (MAALOX/MYLANTA) 200-200-20 MG/5ML suspension 30 mL (has no administration in time range)    ED Course  I have reviewed the triage vital signs and the nursing notes.  Pertinent labs & imaging results that were available during my care of the patient were reviewed by me and considered in my medical decision making (see chart for details).    Given constellation of symptoms there is a concern for covid.  Exam and vitals are benign and reassuring.  No findings on labs.  I suspect the patient is here for a work note as she reports she did not go today.  Strict return precautions given.       Kayla Wilcox was evaluated in Emergency Department on 06/14/2020 for the symptoms described in the history of present illness. She was evaluated in the context of the global COVID-19 pandemic, which necessitated consideration that the patient might be at risk for infection with the SARS-CoV-2 virus that causes COVID-19. Institutional protocols and algorithms that pertain to the evaluation of patients at risk for COVID-19 are in a state of rapid change based on information released by regulatory bodies including  the CDC and federal and state organizations. These policies and algorithms were followed during the patient's care in the ED.  Final Clinical Impression(s) / ED Diagnoses Return for intractable cough, coughing up blood, fevers > 100.4 unrelieved by medication, shortness of breath, intractable vomiting, chest pain, shortness of breath, weakness, numbness, changes in speech, facial asymmetry, abdominal pain, passing out, Inability to tolerate liquids or food, cough, altered mental status or any concerns. No signs of systemic illness or infection. The patient is nontoxic-appearing  on exam and vital signs are within normal limits.  I have reviewed the triage vital signs and the nursing notes. Pertinent labs & imaging results that were available during my care of the patient were reviewed by me and considered in my medical decision making (see chart for details). After history, exam, and medical workup I feel the patient has been appropriately medically screened and is safe for discharge home. Pertinent diagnoses were discussed with the patient. Patient was given return precautions.     Percilla Tweten, MD 06/14/20 [id number]

## 2020-07-29 ENCOUNTER — Encounter (HOSPITAL_BASED_OUTPATIENT_CLINIC_OR_DEPARTMENT_OTHER): Payer: Self-pay | Admitting: Emergency Medicine

## 2020-07-29 ENCOUNTER — Other Ambulatory Visit: Payer: Self-pay

## 2020-07-29 DIAGNOSIS — Y9389 Activity, other specified: Secondary | ICD-10-CM | POA: Insufficient documentation

## 2020-07-29 DIAGNOSIS — J45909 Unspecified asthma, uncomplicated: Secondary | ICD-10-CM | POA: Insufficient documentation

## 2020-07-29 DIAGNOSIS — F17210 Nicotine dependence, cigarettes, uncomplicated: Secondary | ICD-10-CM | POA: Insufficient documentation

## 2020-07-29 DIAGNOSIS — M722 Plantar fascial fibromatosis: Secondary | ICD-10-CM | POA: Insufficient documentation

## 2020-07-29 DIAGNOSIS — M7062 Trochanteric bursitis, left hip: Secondary | ICD-10-CM | POA: Insufficient documentation

## 2020-07-29 DIAGNOSIS — Z859 Personal history of malignant neoplasm, unspecified: Secondary | ICD-10-CM | POA: Insufficient documentation

## 2020-07-29 NOTE — ED Triage Notes (Signed)
Patient presents with complaints of left hip pain onset 1 week ago; denies any known trauma; states pain in bilateral heels as well; states pain in left hand that has resolved now. Ambulatory to triage with steady gait.

## 2020-07-30 ENCOUNTER — Emergency Department (HOSPITAL_BASED_OUTPATIENT_CLINIC_OR_DEPARTMENT_OTHER)
Admission: EM | Admit: 2020-07-30 | Discharge: 2020-07-30 | Disposition: A | Discharge status: Home / Self Care | Payer: Self-pay | Attending: Emergency Medicine | Admitting: Emergency Medicine

## 2020-07-30 DIAGNOSIS — M722 Plantar fascial fibromatosis: Secondary | ICD-10-CM

## 2020-07-30 DIAGNOSIS — M7062 Trochanteric bursitis, left hip: Secondary | ICD-10-CM

## 2020-07-30 MED ORDER — NAPROXEN 500 MG PO TABS: 500.0000 mg | ORAL_TABLET | Freq: Two times a day (BID) | ORAL | 0 refills | Status: AC

## 2020-07-30 MED ORDER — IBUPROFEN 800 MG PO TABS
800.0000 mg | ORAL_TABLET | Freq: Once | ORAL | Status: AC
  Administered 2020-07-30: 800 mg via ORAL
  Filled 2020-07-30: qty 1

## 2020-07-30 NOTE — ED Provider Notes (Signed)
[facility] Provider Note   CSN: [id number] Arrival date & time: 07/29/20  2326     History Chief Complaint  Patient presents with  . Hip Pain  . Foot Pain    Kayla Wilcox is a 35 y.o. female.  HPI     This is a 35 year old female with a history of TMJ and asthma who presents with left hip and left foot pain.  Patient reports 3-week history of Wilcox and worsening left hip and left foot pain.  She states that her foot pain is worse with standing.  States her pain is 10 out of 10.  She has not taken anything for the pain.  Seems to be worse when she stands from sitting for a long prolonged period of time.  No injury.  She also reports some left hip pain.  She has been ambulatory.  It is not significantly painful with range of motion but painful with palpation.  No history of IV drug use.  No fevers.  No history of autoimmune disease.  Past Medical History:  Diagnosis Date  . Asthma   . Cancer ([place])   . TMJ (dislocation of temporomandibular joint)     There are no problems to display for this patient.   Past Surgical History:  Procedure Laterality Date  . LAPAROSCOPIC OVARIAN CYSTECTOMY       OB History   No obstetric history on file.     History reviewed. No pertinent family history.  Social History   Tobacco Use  . Smoking status: Current Every Day Smoker    Packs/day: 0.25    Types: Cigarettes  . Smokeless tobacco: Never Used  Substance Use Topics  . Alcohol use: Yes  . Drug use: Yes    Types: Marijuana    Home Medications Prior to Admission medications   Medication Sig Start Date End Date Taking? Authorizing Provider  albuterol (VENTOLIN HFA) 108 (90 Base) MCG/ACT inhaler Inhale into the lungs. 05/24/17  Yes [provider]  benzonatate (TESSALON) 100 MG capsule Take by mouth. 07/27/20 08/03/20 Yes [provider]  naproxen (NAPROSYN) 500 MG tablet Take 1 tablet (500 mg total) by mouth 2 (two) times daily.  07/30/20  Yes Sharrie Self, Barbette Hair, MD  emtricitabine-tenofovir (TRUVADA) 200-300 MG tablet Take 1 tablet by mouth daily. 01/13/17   Palumbo, April, MD  raltegravir (ISENTRESS) 400 MG tablet Take 1 tablet (400 mg total) by mouth 2 (two) times daily. 01/13/17   Palumbo, April, MD    Allergies    Lisinopril, Cyclobenzaprine, Benadryl [diphenhydramine], and Zofran [ondansetron]  Review of Systems   Review of Systems  Constitutional: Negative for fever.  Respiratory: Negative for shortness of breath.   Cardiovascular: Negative for chest pain.  Musculoskeletal:       Hip pain and foot pain  All other systems reviewed and are negative.   Physical Exam Updated Vital Signs BP 120/83 (BP Location: Right Arm)   Pulse 84   Temp 98.3 F (36.8 C) (Oral)   Resp 20   Ht 1.727 m (5\' 8" )   Wt 121 kg   LMP 07/02/2020   SpO2 100%   BMI 40.56 kg/m   Physical Exam Vitals and nursing note reviewed.  Constitutional:      Appearance: She is well-developed and well-nourished. She is obese. She is not ill-appearing.  HENT:     Head: Normocephalic and atraumatic.     Nose: Nose normal.     Mouth/Throat:  Mouth: Mucous membranes are moist.  Eyes:     Pupils: Pupils are equal, round, and reactive to light.  Cardiovascular:     Rate and Rhythm: Normal rate and regular rhythm.  Pulmonary:     Effort: Pulmonary effort is normal. No respiratory distress.  Musculoskeletal:     Cervical back: Neck supple.     Comments: Point tenderness to palpation over the left trochanter, no obvious deformities, normal range of motion, no overlying skin changes Tenderness to palpation along the plantar fascia of the left foot, no overlying skin change  Skin:    General: Skin is warm and dry.  Neurological:     Mental Status: She is alert and oriented to person, place, and time.  Psychiatric:        Mood and Affect: Mood and affect and mood normal.     ED Results / Procedures / Treatments   Labs (all labs  ordered are listed, but only abnormal results are displayed) Labs Reviewed - No data to display  EKG None  Radiology No results found.  Procedures Procedures   Medications Ordered in ED Medications  ibuprofen (ADVIL) tablet 800 mg (has no administration in time range)    ED Course  I have reviewed the triage vital signs and the nursing notes.  Pertinent labs & imaging results that were available during my care of the patient were reviewed by me and considered in my medical decision making (see chart for details).    MDM Rules/Calculators/A&P                          Patient presents with left hip pain and foot pain.  Overall nontoxic and vital signs are reassuring.  She is afebrile.  Low suspicion for migratory arthritis or systemic infection.  Given point tenderness, no suspect trochanteric bursitis and plantar fasciitis.  Likely unrelated to each other.  She has not taken any anti-inflammatories.  No indication for imaging as I have low suspicion for fracture.  Will start on anti-inflammatories.  Discussed exercises to stretch the plantar fascia.  After history, exam, and medical workup I feel the patient has been appropriately medically screened and is safe for discharge home. Pertinent diagnoses were discussed with the patient. Patient was given return precautions.  Final Clinical Impression(s) / ED Diagnoses Final diagnoses:  Trochanteric bursitis of left hip  Plantar fasciitis    Rx / DC Orders ED Discharge Orders         Ordered    naproxen (NAPROSYN) 500 MG tablet  2 times daily        07/30/20 0054           Libia Fazzini, Barbette Hair, MD 07/30/20 [phone]

## 2020-08-20 ENCOUNTER — Other Ambulatory Visit: Payer: Self-pay

## 2020-08-20 ENCOUNTER — Emergency Department (HOSPITAL_BASED_OUTPATIENT_CLINIC_OR_DEPARTMENT_OTHER)
Admission: EM | Admit: 2020-08-20 | Discharge: 2020-08-20 | Disposition: A | Discharge status: Home / Self Care | Payer: Self-pay | Attending: Emergency Medicine | Admitting: Emergency Medicine

## 2020-08-20 ENCOUNTER — Encounter (HOSPITAL_BASED_OUTPATIENT_CLINIC_OR_DEPARTMENT_OTHER): Payer: Self-pay

## 2020-08-20 DIAGNOSIS — R509 Fever, unspecified: Secondary | ICD-10-CM | POA: Insufficient documentation

## 2020-08-20 DIAGNOSIS — J45909 Unspecified asthma, uncomplicated: Secondary | ICD-10-CM | POA: Insufficient documentation

## 2020-08-20 DIAGNOSIS — F17210 Nicotine dependence, cigarettes, uncomplicated: Secondary | ICD-10-CM | POA: Insufficient documentation

## 2020-08-20 DIAGNOSIS — R197 Diarrhea, unspecified: Secondary | ICD-10-CM | POA: Insufficient documentation

## 2020-08-20 DIAGNOSIS — R112 Nausea with vomiting, unspecified: Secondary | ICD-10-CM | POA: Insufficient documentation

## 2020-08-20 DIAGNOSIS — Z8543 Personal history of malignant neoplasm of ovary: Secondary | ICD-10-CM | POA: Insufficient documentation

## 2020-08-20 DIAGNOSIS — R1084 Generalized abdominal pain: Secondary | ICD-10-CM | POA: Insufficient documentation

## 2020-08-20 DIAGNOSIS — Z7951 Long term (current) use of inhaled steroids: Secondary | ICD-10-CM | POA: Insufficient documentation

## 2020-08-20 HISTORY — DX: Other bursitis of hip, left hip: M70.72

## 2020-08-20 HISTORY — DX: Plantar fascial fibromatosis: M72.2

## 2020-08-20 LAB — CBC WITH DIFFERENTIAL/PLATELET
Abs Immature Granulocytes: 0.02 10*3/uL (ref 0.00–0.07)
Basophils Absolute: 0 10*3/uL (ref 0.0–0.1)
Basophils Relative: 0 %
Eosinophils Absolute: 0.1 10*3/uL (ref 0.0–0.5)
Eosinophils Relative: 1 %
HCT: 41.6 % (ref 36.0–46.0)
Hemoglobin: 13 g/dL (ref 12.0–15.0)
Immature Granulocytes: 0 %
Lymphocytes Relative: 30 %
Lymphs Abs: 1.8 10*3/uL (ref 0.7–4.0)
MCH: 28.4 pg (ref 26.0–34.0)
MCHC: 31.3 g/dL (ref 30.0–36.0)
MCV: 90.8 fL (ref 80.0–100.0)
Monocytes Absolute: 0.6 10*3/uL (ref 0.1–1.0)
Monocytes Relative: 11 %
Neutro Abs: 3.4 10*3/uL (ref 1.7–7.7)
Neutrophils Relative %: 58 %
Platelets: 185 10*3/uL (ref 150–400)
RBC: 4.58 MIL/uL (ref 3.87–5.11)
RDW: 13.2 % (ref 11.5–15.5)
WBC: 5.8 10*3/uL (ref 4.0–10.5)
nRBC: 0 % (ref 0.0–0.2)

## 2020-08-20 LAB — URINALYSIS, ROUTINE W REFLEX MICROSCOPIC
Bilirubin Urine: NEGATIVE
Glucose, UA: NEGATIVE mg/dL
Hgb urine dipstick: NEGATIVE
Ketones, ur: NEGATIVE mg/dL
Leukocytes,Ua: NEGATIVE
Nitrite: NEGATIVE
Protein, ur: NEGATIVE mg/dL
Specific Gravity, Urine: 1.02 (ref 1.005–1.030)
pH: 6.5 (ref 5.0–8.0)

## 2020-08-20 LAB — COMPREHENSIVE METABOLIC PANEL
ALT: 27 U/L (ref 0–44)
AST: 20 U/L (ref 15–41)
Albumin: 3.9 g/dL (ref 3.5–5.0)
Alkaline Phosphatase: 53 U/L (ref 38–126)
Anion gap: 8 (ref 5–15)
BUN: 10 mg/dL (ref 6–20)
CO2: 24 mmol/L (ref 22–32)
Calcium: 8.6 mg/dL — ABNORMAL LOW (ref 8.9–10.3)
Chloride: 106 mmol/L (ref 98–111)
Creatinine, Ser: 0.55 mg/dL (ref 0.44–1.00)
GFR, Estimated: >60 mL/min (ref >60)
Glucose, Bld: 91 mg/dL (ref 70–99)
Potassium: 3.9 mmol/L (ref 3.5–5.1)
Sodium: 138 mmol/L (ref 135–145)
Total Bilirubin: 0.1 mg/dL — ABNORMAL LOW (ref 0.3–1.2)
Total Protein: 6.9 g/dL (ref 6.5–8.1)

## 2020-08-20 LAB — PREGNANCY, URINE: Preg Test, Ur: NEGATIVE

## 2020-08-20 MED ORDER — SODIUM CHLORIDE 0.9 % IV SOLN
12.5000 mg | Freq: Once | INTRAVENOUS | Status: AC
  Administered 2020-08-20: 12.5 mg via INTRAVENOUS
  Filled 2020-08-20: qty 0.5

## 2020-08-20 MED ORDER — PROMETHAZINE HCL 25 MG/ML IJ SOLN
INTRAMUSCULAR | Status: AC
  Filled 2020-08-20: qty 1

## 2020-08-20 MED ORDER — SODIUM CHLORIDE 0.9 % IV BOLUS
1000.0000 mL | Freq: Once | INTRAVENOUS | Status: AC
  Administered 2020-08-20: 1000 mL via INTRAVENOUS

## 2020-08-20 MED ORDER — PROMETHAZINE HCL 25 MG PO TABS: 25.0000 mg | ORAL_TABLET | Freq: Four times a day (QID) | ORAL | 0 refills | Status: DC | PRN

## 2020-08-20 MED ORDER — LOPERAMIDE HCL 2 MG PO CAPS: 2.0000 mg | ORAL_CAPSULE | Freq: Four times a day (QID) | ORAL | 0 refills | Status: AC | PRN

## 2020-08-20 NOTE — ED Triage Notes (Signed)
Pt c/o emesis and diarrhea that started this AM. Pt states she had a fever of 101F a couple days ago. Pt c/o cramping to lower pelvic area.

## 2020-08-20 NOTE — ED Provider Notes (Signed)
[facility] Provider Note   CSN: [id number] Arrival date & time: 08/20/20  1305     History Chief Complaint  Patient presents with  . Emesis    Kayla Wilcox is a 35 y.o. female.  Patient with history of previous ovarian surgery, no other abdominal surgeries presents to the emergency department for evaluation of vomiting and diarrhea.  Patient states that she has had waves of nausea over the past 1 week.  3 days ago she had vomiting and reported fever of 3 F which has not returned.  This morning her symptoms again returned and were worse, with persistent vomiting and diarrhea.  She states that she may have seen a little bit of blood in her vomit and stool, however this is not been persistent.  No urinary symptoms.  No chest pain or shortness of breath.  She reports occasional alcohol use, no recent heavy use.  She does smoke marijuana occasionally, not every day, last use was this morning.  She takes ibuprofen and Tylenol occasionally, but not daily, for TMJ pain.  No other treatments prior to arrival.        Past Medical History:  Diagnosis Date  . Asthma   . Cancer ([place])    ovarian (resolved)  . Hip bursitis, left   . Plantar fasciitis, left   . TMJ (dislocation of temporomandibular joint)     There are no problems to display for this patient.   Past Surgical History:  Procedure Laterality Date  . LAPAROSCOPIC OVARIAN CYSTECTOMY       OB History   No obstetric history on file.     No family history on file.  Social History   Tobacco Use  . Smoking status: Current Every Day Smoker    Packs/day: 0.25    Types: Cigarettes  . Smokeless tobacco: Never Used  Vaping Use  . Vaping Use: Never used  Substance Use Topics  . Alcohol use: Yes  . Drug use: Yes    Types: Marijuana    Home Medications Prior to Admission medications   Medication Sig Start Date End Date Taking? Authorizing Provider  albuterol (VENTOLIN HFA) 108 (90  Base) MCG/ACT inhaler Inhale into the lungs. 05/24/17   [provider]  emtricitabine-tenofovir (TRUVADA) 200-300 MG tablet Take 1 tablet by mouth daily. 01/13/17   Palumbo, April, MD  naproxen (NAPROSYN) 500 MG tablet Take 1 tablet (500 mg total) by mouth 2 (two) times daily. 07/30/20   Horton, Barbette Hair, MD  raltegravir (ISENTRESS) 400 MG tablet Take 1 tablet (400 mg total) by mouth 2 (two) times daily. 01/13/17   Palumbo, April, MD    Allergies    Lisinopril, Cyclobenzaprine, Benadryl [diphenhydramine], Other, Red dye, Tomato, and Zofran [ondansetron]  Review of Systems   Review of Systems  Constitutional: Negative for appetite change and fever.  HENT: Negative for rhinorrhea and sore throat.   Eyes: Negative for redness.  Respiratory: Negative for cough.   Cardiovascular: Negative for chest pain.  Gastrointestinal: Positive for abdominal pain (mild), diarrhea, nausea and vomiting. Negative for blood in stool.       Negative for hematemesis  Genitourinary: Negative for dysuria, vaginal bleeding and vaginal discharge.  Musculoskeletal: Negative for myalgias.  Skin: Negative for rash.  Neurological: Negative for light-headedness.    Physical Exam Updated Vital Signs BP 138/82 (BP Location: Left Arm)   Pulse 71   Temp 99.7 F (37.6 C)   Resp 20   Ht 5\' 8"  (1.727  m)   Wt 122.5 kg   LMP 08/02/2020   SpO2 97%   BMI 41.05 kg/m   Physical Exam Vitals and nursing note reviewed.  Constitutional:      General: She is not in acute distress.    Appearance: She is well-developed.  HENT:     Head: Normocephalic and atraumatic.     Right Ear: External ear normal.     Left Ear: External ear normal.     Nose: Nose normal.  Eyes:     Conjunctiva/sclera: Conjunctivae normal.  Cardiovascular:     Rate and Rhythm: Normal rate and regular rhythm.     Heart sounds: No murmur heard.   Pulmonary:     Effort: No respiratory distress.     Breath sounds: No wheezing, rhonchi or  rales.  Abdominal:     Palpations: Abdomen is soft.     Tenderness: There is abdominal tenderness. There is no guarding or rebound.     Comments: Patient with mild generalized abdominal tenderness to palpation, potentially favoring the left side slightly on my exam.  Musculoskeletal:     Cervical back: Normal range of motion and neck supple.     Right lower leg: No edema.     Left lower leg: No edema.  Skin:    General: Skin is warm and dry.     Findings: No rash.  Neurological:     General: No focal deficit present.     Mental Status: She is alert. Mental status is at baseline.     Motor: No weakness.  Psychiatric:        Mood and Affect: Mood normal.     ED Results / Procedures / Treatments   Labs (all labs ordered are listed, but only abnormal results are displayed) Labs Reviewed  COMPREHENSIVE METABOLIC PANEL - Abnormal; Notable for the following components:      Result Value   Calcium 8.6 (*)    Total Bilirubin 0.1 (*)    All other components within normal limits  URINALYSIS, ROUTINE W REFLEX MICROSCOPIC  PREGNANCY, URINE  CBC WITH DIFFERENTIAL/PLATELET    EKG None  Radiology No results found.  Procedures Procedures   Medications Ordered in ED Medications  promethazine (PHENERGAN) 25 MG/ML injection (  Not Given 08/20/20 1518)  promethazine (PHENERGAN) 12.5 mg in sodium chloride 0.9 % 50 mL IVPB (0 mg Intravenous Stopped 08/20/20 1535)  sodium chloride 0.9 % bolus 1,000 mL (0 mLs Intravenous Stopped 08/20/20 1645)    ED Course  I have reviewed the triage vital signs and the nursing notes.  Pertinent labs & imaging results that were available during my care of the patient were reviewed by me and considered in my medical decision making (see chart for details).  Patient seen and examined. Work-up initiated. Medications ordered. Exam is not overall too concerning. She is not in any distress.   Vital signs reviewed and are as follows: BP 138/82 (BP Location:  Left Arm)   Pulse 71   Temp 99.7 F (37.6 C)   Resp 20   Ht 5\' 8"  (1.727 m)   Wt 122.5 kg   LMP 08/02/2020   SpO2 97%   BMI 41.05 kg/m   Patient rechecked.  Updated on results.  Will give fluid challenge.  Patient has tolerated sips of fluid without vomiting.  She looks well.  She is ready to go home.  The patient was urged to return to the Emergency Department immediately with worsening of current symptoms,  worsening abdominal pain, persistent vomiting, blood noted in stools, fever, or any other concerns. The patient verbalized understanding.     MDM Rules/Calculators/A&P                          Patient with N/V/D, mild generalized abd pain. Vitals are stable, no fever.  Now tolerating PO's after treatment. Lungs are clear. No focal abdominal pain. Labs are very reassuring. Neg preg, neg UA. Low concern for appendicitis, cholecystitis, pancreatitis, ruptured viscus, UTI, kidney stone, aortic dissection, aortic aneurysm or other emergent abdominal etiology. Supportive therapy indicated with return if symptoms worsen. Patient counseled.  Final Clinical Impression(s) / ED Diagnoses Final diagnoses:  Nausea vomiting and diarrhea    Rx / DC Orders ED Discharge Orders         Ordered    promethazine (PHENERGAN) 25 MG tablet  Every 6 hours PRN        08/20/20 1654    loperamide (IMODIUM) 2 MG capsule  4 times daily PRN        08/20/20 1654           Carlisle Cater, PA-C 08/20/20 1658    Isla Pence, MD 08/21/20 [phone]

## 2020-08-20 NOTE — ED Notes (Signed)
Provided water for po challenge

## 2020-08-20 NOTE — Discharge Instructions (Signed)
Please read and follow all provided instructions.  Your diagnoses today include:  1. Nausea vomiting and diarrhea     TTests performed today include:  Blood cell counts and platelets  Kidney and liver function tests  Urine test to look for infection  A blood or urine test for pregnancy (women only)  Vital signs. See below for your results today.   Medications prescribed:   Phenergan (promethazine) - for nausea and vomiting   Loperamide - medication for diarrhea  Take any prescribed medications only as directed.  Home care instructions:   Follow any educational materials contained in this packet.   Your abdominal pain, nausea, vomiting, and diarrhea may be caused by a viral gastroenteritis also called 'stomach flu'. You should rest for the next several days. Keep drinking plenty of fluids and use the medicine for nausea as directed.    Drink clear liquids for the next 24 hours and introduce solid foods slowly after 24 hours using the b.r.a.t. diet (Bananas, Rice, Applesauce, Toast, Yogurt).    Follow-up instructions: Please follow-up with your primary care provider in the next 2 days for further evaluation of your symptoms. If you are not feeling better in 48 hours you may have a condition that is more serious and you need re-evaluation.   Return instructions:  SEEK IMMEDIATE MEDICAL ATTENTION IF:  If you have pain that does not go away or becomes severe   A temperature above 101F develops   Repeated vomiting occurs (multiple episodes)   If you have pain that becomes localized to portions of the abdomen. The right side could possibly be appendicitis. In an adult, the left lower portion of the abdomen could be colitis or diverticulitis.   Blood is being passed in stools or vomit (bright red or black tarry stools)   You develop chest pain, difficulty breathing, dizziness or fainting, or become confused, poorly responsive, or inconsolable (young children)  If you have  any other emergent concerns regarding your health  Additional Information: Abdominal (belly) pain can be caused by many things. Your caregiver performed an examination and possibly ordered blood/urine tests and imaging (CT scan, x-rays, ultrasound). Many cases can be observed and treated at home after initial evaluation in the emergency department. Even though you are being discharged home, abdominal pain can be unpredictable. Therefore, you need a repeated exam if your pain does not resolve, returns, or worsens. Most patients with abdominal pain don't have to be admitted to the hospital or have surgery, but serious problems like appendicitis and gallbladder attacks can start out as nonspecific pain. Many abdominal conditions cannot be diagnosed in one visit, so follow-up evaluations are very important.  Your vital signs today were: BP 128/74 (BP Location: Right Arm)   Pulse (!) 58   Temp 99.7 F (37.6 C)   Resp 18   Ht 5\' 8"  (1.727 m)   Wt 122.5 kg   LMP 08/02/2020   SpO2 100%   BMI 41.05 kg/m  If your blood pressure (bp) was elevated above 135/85 this visit, please have this repeated by your doctor within one month. --------------

## 2020-08-29 ENCOUNTER — Emergency Department (HOSPITAL_BASED_OUTPATIENT_CLINIC_OR_DEPARTMENT_OTHER)
Admission: EM | Admit: 2020-08-29 | Discharge: 2020-08-30 | Disposition: A | Discharge status: Home / Self Care | Payer: Managed Care, Other (non HMO) | Attending: Emergency Medicine | Admitting: Emergency Medicine

## 2020-08-29 ENCOUNTER — Other Ambulatory Visit: Payer: Self-pay

## 2020-08-29 ENCOUNTER — Encounter (HOSPITAL_BASED_OUTPATIENT_CLINIC_OR_DEPARTMENT_OTHER): Payer: Self-pay | Admitting: *Deleted

## 2020-08-29 DIAGNOSIS — R110 Nausea: Secondary | ICD-10-CM | POA: Insufficient documentation

## 2020-08-29 DIAGNOSIS — J45909 Unspecified asthma, uncomplicated: Secondary | ICD-10-CM | POA: Insufficient documentation

## 2020-08-29 DIAGNOSIS — Z7951 Long term (current) use of inhaled steroids: Secondary | ICD-10-CM | POA: Diagnosis not present

## 2020-08-29 DIAGNOSIS — Z8543 Personal history of malignant neoplasm of ovary: Secondary | ICD-10-CM | POA: Diagnosis not present

## 2020-08-29 DIAGNOSIS — R197 Diarrhea, unspecified: Secondary | ICD-10-CM | POA: Insufficient documentation

## 2020-08-29 DIAGNOSIS — F17210 Nicotine dependence, cigarettes, uncomplicated: Secondary | ICD-10-CM | POA: Insufficient documentation

## 2020-08-29 LAB — PREGNANCY, URINE: Preg Test, Ur: NEGATIVE

## 2020-08-29 NOTE — ED Triage Notes (Signed)
Pt reports nausea and diarrhea x 1-2 weeks. States meds aren't helping. Reports +home preg test 3 days ago. She had a negative home preg test yesterday. She is requesting we test her here

## 2020-08-29 NOTE — ED Provider Notes (Signed)
[facility] Provider Note  CSN: [id number] Arrival date & time: 08/29/20 2221    History Chief Complaint  Patient presents with  . Nausea  . Abnormal Lab    HPI  Kayla Wilcox is a 35 y.o. 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 She was seen in the ED on 3/31 for same, workup then was negative including pregnancy test. She took a home pregnancy test 4 days ago that she reports was positive and then another one yesterday she reports was negative. Because of this discrepancy she came back to the ED to find out for sure. She admits she has had bowel problems for some time. The phenergan and lomotil she was given at her previous ED visit has not helped. She has an allergy to Zofran. She is concerned she may have Crohn's disease and understands she will need to follow up with GI to have that investigated.    Past Medical History:  Diagnosis Date  . Asthma   . Cancer ([place])    ovarian (resolved)  . Hip bursitis, left   . Plantar fasciitis, left   . TMJ (dislocation of temporomandibular joint)     Past Surgical History:  Procedure Laterality Date  . LAPAROSCOPIC OVARIAN CYSTECTOMY      No family history on file.  Social History   Tobacco Use  . Smoking status: Current Every Day Smoker    Packs/day: 0.25    Types: Cigarettes  . Smokeless tobacco: Never Used  Vaping Use  . Vaping Use: Never used  Substance Use Topics  . Alcohol use: Yes  . Drug use: Yes    Types: Marijuana     Home Medications Prior to Admission medications   Medication Sig Start Date End Date Taking? Authorizing Provider  albuterol (VENTOLIN HFA) 108 (90 Base) MCG/ACT inhaler Inhale into the lungs. 05/24/17   [provider]  emtricitabine-tenofovir (TRUVADA) 200-300 MG tablet Take 1 tablet by mouth daily. 01/13/17   Palumbo, April, MD  loperamide (IMODIUM) 2 MG capsule Take 1 capsule (2 mg total) by mouth 4  (four) times daily as needed for diarrhea or loose stools. 08/20/20   Carlisle Cater, PA-C  naproxen (NAPROSYN) 500 MG tablet Take 1 tablet (500 mg total) by mouth 2 (two) times daily. 07/30/20   Horton, Barbette Hair, MD  promethazine (PHENERGAN) 25 MG tablet Take 1 tablet (25 mg total) by mouth every 6 (six) hours as needed for nausea or vomiting. 08/20/20   Carlisle Cater, PA-C  raltegravir (ISENTRESS) 400 MG tablet Take 1 tablet (400 mg total) by mouth 2 (two) times daily. 01/13/17   Palumbo, April, MD     Allergies    Lisinopril, Cyclobenzaprine, Benadryl [diphenhydramine], Other, Red dye, Tomato, and Zofran [ondansetron]   Review of Systems   Review of Systems A comprehensive review of systems was completed and negative except as noted in HPI.    Physical Exam BP 125/77 (BP Location: Right Arm)   Pulse 73   Temp 98.4 F (36.9 C) (Oral)   Resp 18   Ht 5\' 9"  (1.753 m)   LMP 08/02/2020   SpO2 100%   BMI 39.87 kg/m   Physical Exam Vitals and nursing note reviewed.  HENT:     Head: Normocephalic.     Nose: Nose normal.  Eyes:     Extraocular Movements: Extraocular movements intact.  Pulmonary:     Effort: Pulmonary effort is  normal.  Musculoskeletal:        General: Normal range of motion.     Cervical back: Neck supple.  Skin:    Findings: No rash (on exposed skin).  Neurological:     Mental Status: She is alert and oriented to person, place, and time.  Psychiatric:        Mood and Affect: Mood normal.      ED Results / Procedures / Treatments   Labs (all labs ordered are listed, but only abnormal results are displayed) Labs Reviewed  PREGNANCY, URINE    EKG None   Radiology No results found.  Procedures Procedures  Medications Ordered in the ED Medications - No data to display   MDM Rules/Calculators/A&P MDM Patient with continued nausea and diarrhea here to have a confirmatory pregnancy test.   ED Course  I have reviewed the triage vital signs  and the nursing notes.  Pertinent labs & imaging results that were available during my care of the patient were reviewed by me and considered in my medical decision making (see chart for details).  Clinical Course as of 08/29/20 2343  Sat Aug 29, 2020  2341 Pregnancy test confirmed negative. Patient will be referred to GI for further evaluation of her symptoms.  [CS]    Clinical Course User Index [CS] Truddie Hidden, MD    Final Clinical Impression(s) / ED Diagnoses Final diagnoses:  Nausea    Rx / DC Orders ED Discharge Orders    None       Truddie Hidden, MD 08/29/20 2343

## 2020-11-03 ENCOUNTER — Emergency Department (HOSPITAL_BASED_OUTPATIENT_CLINIC_OR_DEPARTMENT_OTHER): Payer: Managed Care, Other (non HMO)

## 2020-11-03 ENCOUNTER — Encounter (HOSPITAL_BASED_OUTPATIENT_CLINIC_OR_DEPARTMENT_OTHER): Payer: Self-pay | Admitting: *Deleted

## 2020-11-03 ENCOUNTER — Emergency Department (HOSPITAL_BASED_OUTPATIENT_CLINIC_OR_DEPARTMENT_OTHER)
Admission: EM | Admit: 2020-11-03 | Discharge: 2020-11-03 | Disposition: A | Discharge status: Home / Self Care | Payer: Managed Care, Other (non HMO) | Attending: Emergency Medicine | Admitting: Emergency Medicine

## 2020-11-03 ENCOUNTER — Other Ambulatory Visit: Payer: Self-pay

## 2020-11-03 DIAGNOSIS — F17210 Nicotine dependence, cigarettes, uncomplicated: Secondary | ICD-10-CM | POA: Insufficient documentation

## 2020-11-03 DIAGNOSIS — M25561 Pain in right knee: Secondary | ICD-10-CM | POA: Insufficient documentation

## 2020-11-03 DIAGNOSIS — Z7951 Long term (current) use of inhaled steroids: Secondary | ICD-10-CM | POA: Insufficient documentation

## 2020-11-03 DIAGNOSIS — Z8543 Personal history of malignant neoplasm of ovary: Secondary | ICD-10-CM | POA: Insufficient documentation

## 2020-11-03 DIAGNOSIS — J45909 Unspecified asthma, uncomplicated: Secondary | ICD-10-CM | POA: Insufficient documentation

## 2020-11-03 NOTE — ED Provider Notes (Signed)
[facility] Provider Note   CSN: [id number] Arrival date & time: 11/03/20  1240     History Chief Complaint  Patient presents with   Knee Pain    Kayla Wilcox is a 35 y.o. female.  HPI Patient is a 35 year old female with a medical history as noted below.  She presents to the emergency department due to atraumatic right knee pain for the past 3 days.  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  She states that over the past 2 days she has also been climbing stairs more frequently and began noticing a pain to the right anterior knee that worsens when climbing stairs.  No other complaints.    Past Medical History:  Diagnosis Date   Asthma    Cancer ([place])    ovarian (resolved)   Hip bursitis, left    Plantar fasciitis, left    TMJ (dislocation of temporomandibular joint)     There are no problems to display for this patient.   Past Surgical History:  Procedure Laterality Date   LAPAROSCOPIC OVARIAN CYSTECTOMY       OB History   No obstetric history on file.     No family history on file.  Social History   Tobacco Use   Smoking status: Every Day    Packs/day: 0.25    Pack years: 0.00    Types: Cigarettes   Smokeless tobacco: Never  Vaping Use   Vaping Use: Never used  Substance Use Topics   Alcohol use: Yes   Drug use: Yes    Types: Marijuana    Home Medications Prior to Admission medications   Medication Sig Start Date End Date Taking? Authorizing Provider  albuterol (VENTOLIN HFA) 108 (90 Base) MCG/ACT inhaler Inhale into the lungs. 05/24/17   [provider]  emtricitabine-tenofovir (TRUVADA) 200-300 MG tablet Take 1 tablet by mouth daily. 01/13/17   Palumbo, April, MD  loperamide (IMODIUM) 2 MG capsule Take 1 capsule (2 mg total) by mouth 4 (four) times daily as needed for diarrhea or loose stools. 08/20/20   Carlisle Cater, PA-C   naproxen (NAPROSYN) 500 MG tablet Take 1 tablet (500 mg total) by mouth 2 (two) times daily. 07/30/20   Horton, Barbette Hair, MD  promethazine (PHENERGAN) 25 MG tablet Take 1 tablet (25 mg total) by mouth every 6 (six) hours as needed for nausea or vomiting. 08/20/20   Carlisle Cater, PA-C  raltegravir (ISENTRESS) 400 MG tablet Take 1 tablet (400 mg total) by mouth 2 (two) times daily. 01/13/17   Palumbo, April, MD    Allergies    Lisinopril, Cyclobenzaprine, Benadryl [diphenhydramine], Other, Red dye, Tomato, and Zofran [ondansetron]  Review of Systems   Review of Systems  Musculoskeletal:  Positive for arthralgias and myalgias.  Skin:  Negative for color change and wound.  Neurological:  Negative for weakness and numbness.   Physical Exam Updated Vital Signs BP (!) 145/83 (BP Location: Right Arm)   Pulse (!) 56   Temp 98.4 F (36.9 C) (Oral)   Resp 18   Ht 5\' 8"  (1.727 m)   Wt 128.5 kg   LMP 11/02/2020   SpO2 99%   BMI 43.07 kg/m   Physical Exam Vitals and nursing note reviewed.  Constitutional:      General: She is not in acute distress.    Appearance: She is well-developed.  HENT:  Head: Normocephalic and atraumatic.     Right Ear: External ear normal.     Left Ear: External ear normal.  Eyes:     General: No scleral icterus.       Right eye: No discharge.        Left eye: No discharge.     Conjunctiva/sclera: Conjunctivae normal.  Neck:     Trachea: No tracheal deviation.  Cardiovascular:     Rate and Rhythm: Normal rate.  Pulmonary:     Effort: Pulmonary effort is normal. No respiratory distress.     Breath sounds: No stridor.  Abdominal:     General: There is no distension.  Musculoskeletal:        General: Tenderness present. No swelling or deformity.     Cervical back: Neck supple.     Comments: Mild TTP noted inferolaterally of the patella in the right knee.  No tenderness appreciated along the medial or lateral joint line.  Unable to assess the joint  for laxity or range of motion due to patient's pain.  Distal sensation intact.  2+ DP pulses.  No visible joint effusion.  Skin:    General: Skin is warm and dry.     Findings: No rash.  Neurological:     Mental Status: She is alert.     Cranial Nerves: Cranial nerve deficit: no gross deficits.   ED Results / Procedures / Treatments   Labs (all labs ordered are listed, but only abnormal results are displayed) Labs Reviewed - No data to display  EKG None  Radiology DG Knee Complete 4 Views Right  Result Date: 11/03/2020 CLINICAL DATA:  Pain with no known injury. EXAM: RIGHT KNEE - COMPLETE 4+ VIEW COMPARISON:  None. FINDINGS: No evidence of fracture, dislocation, or joint effusion. No evidence of arthropathy or other focal bone abnormality. Soft tissues are unremarkable. IMPRESSION: Negative. Electronically Signed   By: Margaretha Sheffield MD   On: 11/03/2020 13:27    Procedures Procedures   Medications Ordered in ED Medications - No data to display  ED Course  I have reviewed the triage vital signs and the nursing notes.  Pertinent labs & imaging results that were available during my care of the patient were reviewed by me and considered in my medical decision making (see chart for details).    MDM Rules/Calculators/A&P                          Pt is a 35 y.o. female who presents to the emergency department due to atraumatic right knee pain.  Imaging: X-ray of the right knee is negative.  I, [name], PA-C, personally reviewed and evaluated these images and lab results as part of my medical decision-making.  Unsure the source of the patient's pain.  Likely an overuse injury.  It started over the past couple of days after climbing stairs more frequently.  X-ray is negative.  She has moderate tenderness just inferior lateral to the patella.  No visible joint effusion.  Unable to assess laxity or range of motion due to patient's pain.  Patient given a knee sleeve for  comfort.  Recommended continued use of Tylenol/ibuprofen for management of her pain.  We discussed dosing.  Also discussed topical pain relieving creams.  Patient given a referral to sports medicine if she finds that her symptoms are refractory.  Feel that she is stable for discharge at this time and she is agreeable.  Her questions  were answered and she was amicable at the time of discharge.  Note: Portions of this report may have been transcribed using voice recognition software. Every effort was made to ensure accuracy; however, inadvertent computerized transcription errors may be present.   Final Clinical Impression(s) / ED Diagnoses Final diagnoses:  Acute pain of right knee   Rx / DC Orders ED Discharge Orders     None        [name], PA-C 11/03/20 [place], Adam, DO 11/03/20 1426

## 2020-11-03 NOTE — ED Notes (Signed)
Discharge instructions discussed with pt. Pt verbalized understanding. Pt stable and ambulatory. No signature pad available. 

## 2020-11-03 NOTE — ED Triage Notes (Signed)
Right knee pain x3 days.  No known injury.

## 2020-11-03 NOTE — Discharge Instructions (Addendum)
I recommend a combination of tylenol and ibuprofen for management of your pain. You can take a low dose of both at the same time. I recommend 500 mg of Tylenol combined with 600 mg of ibuprofen. This is one maximum strength Tylenol and three regular ibuprofen. You can take these 2-3 times for day for your pain. Please try to take these medications with a small amount of food as well to prevent upsetting your stomach.  Also, please consider topical pain relieving creams such as Voltaran Gel, BioFreeze, or Icy Hot. There is also a pain relieving cream made by [employer]. You should be able to find all of these at your local pharmacy.   Below is the information for Dr. Raeford Razor.  He has a local sports medicine doctor that is located in the same building as this emergency department please give them a call 1 to 2 weeks if your symptoms have not improved.  If you develop any new or worsening symptoms please do not hesitate to return to the emergency department. It was a pleasure to meet you.

## 2021-08-08 ENCOUNTER — Other Ambulatory Visit: Payer: Self-pay

## 2021-08-08 ENCOUNTER — Emergency Department (HOSPITAL_BASED_OUTPATIENT_CLINIC_OR_DEPARTMENT_OTHER)
Admission: EM | Admit: 2021-08-08 | Discharge: 2021-08-08 | Disposition: A | Discharge status: Home / Self Care | Payer: Self-pay | Attending: Emergency Medicine | Admitting: Emergency Medicine

## 2021-08-08 ENCOUNTER — Encounter (HOSPITAL_BASED_OUTPATIENT_CLINIC_OR_DEPARTMENT_OTHER): Payer: Self-pay | Admitting: Emergency Medicine

## 2021-08-08 DIAGNOSIS — R197 Diarrhea, unspecified: Secondary | ICD-10-CM | POA: Insufficient documentation

## 2021-08-08 DIAGNOSIS — F17200 Nicotine dependence, unspecified, uncomplicated: Secondary | ICD-10-CM | POA: Insufficient documentation

## 2021-08-08 DIAGNOSIS — M549 Dorsalgia, unspecified: Secondary | ICD-10-CM | POA: Insufficient documentation

## 2021-08-08 DIAGNOSIS — R7309 Other abnormal glucose: Secondary | ICD-10-CM | POA: Insufficient documentation

## 2021-08-08 DIAGNOSIS — R112 Nausea with vomiting, unspecified: Secondary | ICD-10-CM | POA: Insufficient documentation

## 2021-08-08 DIAGNOSIS — R109 Unspecified abdominal pain: Secondary | ICD-10-CM | POA: Insufficient documentation

## 2021-08-08 DIAGNOSIS — Z8543 Personal history of malignant neoplasm of ovary: Secondary | ICD-10-CM | POA: Insufficient documentation

## 2021-08-08 DIAGNOSIS — J45909 Unspecified asthma, uncomplicated: Secondary | ICD-10-CM | POA: Insufficient documentation

## 2021-08-08 LAB — URINALYSIS, ROUTINE W REFLEX MICROSCOPIC
Bilirubin Urine: NEGATIVE
Glucose, UA: NEGATIVE mg/dL
Hgb urine dipstick: NEGATIVE
Ketones, ur: NEGATIVE mg/dL
Leukocytes,Ua: NEGATIVE
Nitrite: NEGATIVE
Protein, ur: NEGATIVE mg/dL
Specific Gravity, Urine: 1.025 (ref 1.005–1.030)
pH: 7 (ref 5.0–8.0)

## 2021-08-08 LAB — COMPREHENSIVE METABOLIC PANEL
ALT: 33 U/L (ref 0–44)
AST: 19 U/L (ref 15–41)
Albumin: 3.8 g/dL (ref 3.5–5.0)
Alkaline Phosphatase: 44 U/L (ref 38–126)
Anion gap: 5 (ref 5–15)
BUN: 10 mg/dL (ref 6–20)
CO2: 24 mmol/L (ref 22–32)
Calcium: 8.8 mg/dL — ABNORMAL LOW (ref 8.9–10.3)
Chloride: 110 mmol/L (ref 98–111)
Creatinine, Ser: 0.55 mg/dL (ref 0.44–1.00)
GFR, Estimated: >60 mL/min (ref >60)
Glucose, Bld: 115 mg/dL — ABNORMAL HIGH (ref 70–99)
Potassium: 3.8 mmol/L (ref 3.5–5.1)
Sodium: 139 mmol/L (ref 135–145)
Total Bilirubin: 0.4 mg/dL (ref 0.3–1.2)
Total Protein: 6.6 g/dL (ref 6.5–8.1)

## 2021-08-08 LAB — CBC WITH DIFFERENTIAL/PLATELET
Abs Immature Granulocytes: 0.01 10*3/uL (ref 0.00–0.07)
Basophils Absolute: 0 10*3/uL (ref 0.0–0.1)
Basophils Relative: 0 %
Eosinophils Absolute: 0.1 10*3/uL (ref 0.0–0.5)
Eosinophils Relative: 1 %
HCT: 40.6 % (ref 36.0–46.0)
Hemoglobin: 12.9 g/dL (ref 12.0–15.0)
Immature Granulocytes: 0 %
Lymphocytes Relative: 39 %
Lymphs Abs: 1.8 10*3/uL (ref 0.7–4.0)
MCH: 28.9 pg (ref 26.0–34.0)
MCHC: 31.8 g/dL (ref 30.0–36.0)
MCV: 90.8 fL (ref 80.0–100.0)
Monocytes Absolute: 0.3 10*3/uL (ref 0.1–1.0)
Monocytes Relative: 7 %
Neutro Abs: 2.4 10*3/uL (ref 1.7–7.7)
Neutrophils Relative %: 53 %
Platelets: 184 10*3/uL (ref 150–400)
RBC: 4.47 MIL/uL (ref 3.87–5.11)
RDW: 13.6 % (ref 11.5–15.5)
WBC: 4.6 10*3/uL (ref 4.0–10.5)
nRBC: 0 % (ref 0.0–0.2)

## 2021-08-08 LAB — HCG, SERUM, QUALITATIVE: Preg, Serum: NEGATIVE

## 2021-08-08 LAB — LIPASE, BLOOD: Lipase: 30 U/L (ref 11–51)

## 2021-08-08 MED ORDER — SODIUM CHLORIDE 0.9 % IV BOLUS
1000.0000 mL | Freq: Once | INTRAVENOUS | Status: AC
  Administered 2021-08-08: 1000 mL via INTRAVENOUS

## 2021-08-08 MED ORDER — PROMETHAZINE HCL 25 MG PO TABS: 25.0000 mg | ORAL_TABLET | Freq: Four times a day (QID) | ORAL | 0 refills | Status: DC | PRN

## 2021-08-08 MED ORDER — PROCHLORPERAZINE EDISYLATE 10 MG/2ML IJ SOLN
10.0000 mg | Freq: Once | INTRAMUSCULAR | Status: AC
  Administered 2021-08-08: 10 mg via INTRAVENOUS
  Filled 2021-08-08: qty 2

## 2021-08-08 MED ORDER — FAMOTIDINE IN NACL 20-0.9 MG/50ML-% IV SOLN
20.0000 mg | Freq: Once | INTRAVENOUS | Status: AC
  Administered 2021-08-08: 20 mg via INTRAVENOUS
  Filled 2021-08-08: qty 50

## 2021-08-08 NOTE — ED Notes (Signed)
ED Provider at bedside. 

## 2021-08-08 NOTE — ED Provider Notes (Signed)
?[name] ?Provider Note ? ? ?CSN: [id number] ?Arrival date & time: 08/08/21  1255 ? ?  ? ?History ? ?Chief Complaint  ?Patient presents with  ? Emesis  ? ? ?Kayla Wilcox is a 36 y.o. female.  She has a history of ovarian cancer asthma seizures.  Complaining of abdominal and back pain, nausea vomiting x2 and 1 episode of loose stools that started last evening.  Continue on today.  Has tried nothing for it.  No sick contacts or recent travel.  No fevers or chills.  No urinary symptoms.  Last menstrual period about 3 weeks ago.  No vaginal bleeding or discharge. ? ?The history is provided by the patient.  ?Emesis ?Severity:  Moderate ?Duration:  18 hours ?Timing:  Sporadic ?Number of daily episodes:  2 ?Quality:  Stomach contents ?Progression:  Unchanged ?Chronicity:  Recurrent ?Recent urination:  Normal ?Relieved by:  Nothing ?Worsened by:  Liquids ?Ineffective treatments:  None tried ?Associated symptoms: abdominal pain and diarrhea   ?Associated symptoms: no cough, no fever, no headaches, no myalgias and no sore throat   ?Risk factors: no sick contacts, no suspect food intake and no travel to endemic areas   ? ?  ? ?Home Medications ?Prior to Admission medications   ?Medication Sig Start Date End Date Taking? Authorizing Provider  ?albuterol (VENTOLIN HFA) 108 (90 Base) MCG/ACT inhaler Inhale into the lungs. 05/24/17   [provider]  ?emtricitabine-tenofovir (TRUVADA) 200-300 MG tablet Take 1 tablet by mouth daily. 01/13/17   Palumbo, April, MD  ?loperamide (IMODIUM) 2 MG capsule Take 1 capsule (2 mg total) by mouth 4 (four) times daily as needed for diarrhea or loose stools. 08/20/20   Carlisle Cater, PA-C  ?naproxen (NAPROSYN) 500 MG tablet Take 1 tablet (500 mg total) by mouth 2 (two) times daily. 07/30/20   Horton, Barbette Hair, MD  ?promethazine (PHENERGAN) 25 MG tablet Take 1 tablet (25 mg total) by mouth every 6 (six) hours as needed for nausea or vomiting. 08/20/20    Carlisle Cater, PA-C  ?raltegravir (ISENTRESS) 400 MG tablet Take 1 tablet (400 mg total) by mouth 2 (two) times daily. 01/13/17   Palumbo, April, MD  ?   ? ?Allergies    ?Lisinopril, Cyclobenzaprine, Benadryl [diphenhydramine], Other, Red dye, Tomato, and Zofran [ondansetron]   ? ?Review of Systems   ?Review of Systems  ?Constitutional:  Negative for fever.  ?HENT:  Negative for sore throat.   ?Respiratory:  Negative for cough.   ?Gastrointestinal:  Positive for abdominal pain, diarrhea, nausea and vomiting.  ?Musculoskeletal:  Positive for back pain. Negative for myalgias.  ?Skin:  Negative for rash.  ?Neurological:  Negative for headaches.  ? ?Physical Exam ?Updated Vital Signs ?BP 118/76 (BP Location: Right Arm)   Pulse 73   Temp 98.3 ?F (36.8 ?C) (Oral)   Resp 20   Ht '5\' 8"'$  (1.727 m)   Wt 127 kg   LMP 07/16/2021   SpO2 96%   BMI 42.57 kg/m?  ?Physical Exam ?Vitals and nursing note reviewed.  ?Constitutional:   ?   General: She is not in acute distress. ?   Appearance: Normal appearance. She is well-developed. She is obese.  ?HENT:  ?   Head: Normocephalic and atraumatic.  ?Eyes:  ?   Conjunctiva/sclera: Conjunctivae normal.  ?Cardiovascular:  ?   Rate and Rhythm: Normal rate and regular rhythm.  ?   Heart sounds: No murmur heard. ?Pulmonary:  ?   Effort: Pulmonary effort is  normal. No respiratory distress.  ?   Breath sounds: Normal breath sounds.  ?Abdominal:  ?   Palpations: Abdomen is soft.  ?   Tenderness: There is no abdominal tenderness. There is no guarding or rebound.  ?Musculoskeletal:     ?   General: No swelling.  ?   Cervical back: Neck supple.  ?Skin: ?   General: Skin is warm and dry.  ?   Capillary Refill: Capillary refill takes less than 2 seconds.  ?Neurological:  ?   General: No focal deficit present.  ?   Mental Status: She is alert.  ? ? ?ED Results / Procedures / Treatments   ?Labs ?(all labs ordered are listed, but only abnormal results are displayed) ?Labs Reviewed  ?COMPREHENSIVE  METABOLIC PANEL - Abnormal; Notable for the following components:  ?    Result Value  ? Glucose, Bld 115 (*)   ? Calcium 8.8 (*)   ? All other components within normal limits  ?LIPASE, BLOOD  ?HCG, SERUM, QUALITATIVE  ?CBC WITH DIFFERENTIAL/PLATELET  ?URINALYSIS, ROUTINE W REFLEX MICROSCOPIC  ? ? ?EKG ?None ? ?Radiology ?No results found. ? ?Procedures ?Procedures  ? ? ?Medications Ordered in ED ?Medications  ?sodium chloride 0.9 % bolus 1,000 mL (has no administration in time range)  ?prochlorperazine (COMPAZINE) injection 10 mg (has no administration in time range)  ?famotidine (PEPCID) IVPB 20 mg premix (has no administration in time range)  ? ? ?ED Course/ Medical Decision Making/ A&P ?Clinical Course as of 08/08/21 1714  ?Sun Aug 08, 2021  ?1451 Patient feeling better after medications and fluids.  Will discharge after she finishes off her fluids. [MB]  ?  ?Clinical Course User Index ?[MB] Hayden Rasmussen, MD  ? ?                        ?Medical Decision Making ?Amount and/or Complexity of Data Reviewed ?Labs: ordered. ? ?Risk ?Prescription drug management. ? ?This patient complains of nausea vomiting diarrhea; this involves an extensive number of treatment ?Options and is a complaint that carries with it a high risk of complications and ?morbidity. The differential includes gastroenteritis, food poisoning, dehydration, metabolic derangement, colitis, diverticulitis, peptic ulcer disease ? ?I ordered, reviewed and interpreted labs, which included CBC with normal white count normal hemoglobin, chemistries normal other than mildly elevated glucose, normal LFTs, urinalysis negative, pregnancy test negative ?I ordered medication IV fluids Pepcid and Compazine with improvement in her symptoms and reviewed PMP when indicated. ?Additional history obtained from patient's family member ?Previous records obtained and reviewed in epic, patient was at [facility] just a few weeks ago for similar  presentation ?Cardiac monitoring reviewed, normal sinus rhythm ?Social determinants considered, active smoker and marijuana use ?Critical Interventions: None ? ?After the interventions stated above, I reevaluated the patient and found patient symptoms to be improved ?Admission and further testing considered, no indications for admission or further work-up at this time.  Will prescribe her Phenergan for nausea as she has an allergy to Zofran.  Note for work.  Return instructions discussed ? ? ? ? ? ? ? ? ? ?Final Clinical Impression(s) / ED Diagnoses ?Final diagnoses:  ?Nausea vomiting and diarrhea  ? ? ?Rx / DC Orders ?ED Discharge Orders   ? ?      Ordered  ?  promethazine (PHENERGAN) 25 MG tablet  Every 6 hours PRN       ? 08/08/21 1453  ? ?  ?  ? ?  ? ? ?  ?  Hayden Rasmussen, MD ?08/08/21 1716 ? ?

## 2021-08-08 NOTE — Discharge Instructions (Signed)
You are seen in the emergency department for nausea vomiting diarrhea.  You had lab work urinalysis that were unremarkable.  You received some medications for your symptoms with improvement.  Please start with a clear liquid diet and advance as tolerated.  Nausea medication as needed.  Return to the emergency department if any high fevers or worsening symptoms ?

## 2021-08-08 NOTE — ED Triage Notes (Signed)
Vomiting and diarrhea since yesterday after eating FF at work. Abd and back pain ?

## 2021-08-21 ENCOUNTER — Encounter (HOSPITAL_BASED_OUTPATIENT_CLINIC_OR_DEPARTMENT_OTHER): Payer: Self-pay | Admitting: Emergency Medicine

## 2021-08-21 ENCOUNTER — Other Ambulatory Visit: Payer: Self-pay

## 2021-08-21 ENCOUNTER — Emergency Department (HOSPITAL_BASED_OUTPATIENT_CLINIC_OR_DEPARTMENT_OTHER)
Admission: EM | Admit: 2021-08-21 | Discharge: 2021-08-21 | Disposition: A | Discharge status: Home / Self Care | Payer: Self-pay | Attending: Emergency Medicine | Admitting: Emergency Medicine

## 2021-08-21 ENCOUNTER — Emergency Department (HOSPITAL_BASED_OUTPATIENT_CLINIC_OR_DEPARTMENT_OTHER): Payer: Self-pay

## 2021-08-21 DIAGNOSIS — J45909 Unspecified asthma, uncomplicated: Secondary | ICD-10-CM | POA: Insufficient documentation

## 2021-08-21 DIAGNOSIS — K5900 Constipation, unspecified: Secondary | ICD-10-CM | POA: Insufficient documentation

## 2021-08-21 DIAGNOSIS — M26609 Unspecified temporomandibular joint disorder, unspecified side: Secondary | ICD-10-CM

## 2021-08-21 DIAGNOSIS — M26629 Arthralgia of temporomandibular joint, unspecified side: Secondary | ICD-10-CM | POA: Insufficient documentation

## 2021-08-21 DIAGNOSIS — Z8543 Personal history of malignant neoplasm of ovary: Secondary | ICD-10-CM | POA: Insufficient documentation

## 2021-08-21 DIAGNOSIS — R1013 Epigastric pain: Secondary | ICD-10-CM

## 2021-08-21 DIAGNOSIS — E876 Hypokalemia: Secondary | ICD-10-CM | POA: Insufficient documentation

## 2021-08-21 DIAGNOSIS — R112 Nausea with vomiting, unspecified: Secondary | ICD-10-CM | POA: Insufficient documentation

## 2021-08-21 LAB — CBC WITH DIFFERENTIAL/PLATELET
Abs Immature Granulocytes: 0.01 10*3/uL (ref 0.00–0.07)
Basophils Absolute: 0 10*3/uL (ref 0.0–0.1)
Basophils Relative: 0 %
Eosinophils Absolute: 0.1 10*3/uL (ref 0.0–0.5)
Eosinophils Relative: 1 %
HCT: 39 % (ref 36.0–46.0)
Hemoglobin: 12.5 g/dL (ref 12.0–15.0)
Immature Granulocytes: 0 %
Lymphocytes Relative: 46 %
Lymphs Abs: 2.2 10*3/uL (ref 0.7–4.0)
MCH: 29.2 pg (ref 26.0–34.0)
MCHC: 32.1 g/dL (ref 30.0–36.0)
MCV: 91.1 fL (ref 80.0–100.0)
Monocytes Absolute: 0.4 10*3/uL (ref 0.1–1.0)
Monocytes Relative: 8 %
Neutro Abs: 2.2 10*3/uL (ref 1.7–7.7)
Neutrophils Relative %: 45 %
Platelets: 181 10*3/uL (ref 150–400)
RBC: 4.28 MIL/uL (ref 3.87–5.11)
RDW: 13.2 % (ref 11.5–15.5)
WBC: 4.8 10*3/uL (ref 4.0–10.5)
nRBC: 0 % (ref 0.0–0.2)

## 2021-08-21 LAB — URINALYSIS, ROUTINE W REFLEX MICROSCOPIC
Bilirubin Urine: NEGATIVE
Glucose, UA: NEGATIVE mg/dL
Ketones, ur: NEGATIVE mg/dL
Leukocytes,Ua: NEGATIVE
Nitrite: NEGATIVE
Protein, ur: NEGATIVE mg/dL
Specific Gravity, Urine: >=1.03 (ref 1.005–1.030)
pH: 7 (ref 5.0–8.0)

## 2021-08-21 LAB — COMPREHENSIVE METABOLIC PANEL
ALT: 25 U/L (ref 0–44)
AST: 20 U/L (ref 15–41)
Albumin: 3.9 g/dL (ref 3.5–5.0)
Alkaline Phosphatase: 40 U/L (ref 38–126)
Anion gap: 6 (ref 5–15)
BUN: 10 mg/dL (ref 6–20)
CO2: 25 mmol/L (ref 22–32)
Calcium: 8.4 mg/dL — ABNORMAL LOW (ref 8.9–10.3)
Chloride: 110 mmol/L (ref 98–111)
Creatinine, Ser: 0.65 mg/dL (ref 0.44–1.00)
GFR, Estimated: >60 mL/min (ref >60)
Glucose, Bld: 101 mg/dL — ABNORMAL HIGH (ref 70–99)
Potassium: 3.4 mmol/L — ABNORMAL LOW (ref 3.5–5.1)
Sodium: 141 mmol/L (ref 135–145)
Total Bilirubin: 0.5 mg/dL (ref 0.3–1.2)
Total Protein: 6.7 g/dL (ref 6.5–8.1)

## 2021-08-21 LAB — LIPASE, BLOOD: Lipase: 30 U/L (ref 11–51)

## 2021-08-21 LAB — URINALYSIS, MICROSCOPIC (REFLEX)

## 2021-08-21 LAB — PREGNANCY, URINE: Preg Test, Ur: NEGATIVE

## 2021-08-21 MED ORDER — PROCHLORPERAZINE EDISYLATE 10 MG/2ML IJ SOLN
10.0000 mg | Freq: Once | INTRAMUSCULAR | Status: AC
  Administered 2021-08-21: 10 mg via INTRAVENOUS
  Filled 2021-08-21: qty 2

## 2021-08-21 MED ORDER — KETOROLAC TROMETHAMINE 15 MG/ML IJ SOLN
15.0000 mg | Freq: Once | INTRAMUSCULAR | Status: AC
  Administered 2021-08-21: 15 mg via INTRAVENOUS
  Filled 2021-08-21: qty 1

## 2021-08-21 MED ORDER — DICYCLOMINE HCL 20 MG PO TABS: 20.0000 mg | ORAL_TABLET | Freq: Two times a day (BID) | ORAL | 0 refills | Status: AC

## 2021-08-21 MED ORDER — PANTOPRAZOLE SODIUM 40 MG PO TBEC: 40.0000 mg | DELAYED_RELEASE_TABLET | Freq: Every day | ORAL | 0 refills | Status: AC

## 2021-08-21 MED ORDER — IOHEXOL 350 MG/ML SOLN
100.0000 mL | Freq: Once | INTRAVENOUS | Status: AC | PRN
  Administered 2021-08-21: 100 mL via INTRAVENOUS

## 2021-08-21 MED ORDER — PROMETHAZINE HCL 25 MG PO TABS: 25.0000 mg | ORAL_TABLET | Freq: Four times a day (QID) | ORAL | 0 refills | Status: AC | PRN

## 2021-08-21 MED ORDER — PANTOPRAZOLE SODIUM 40 MG IV SOLR
40.0000 mg | Freq: Once | INTRAVENOUS | Status: AC
Start: 2021-08-21 — End: 2021-08-21
  Administered 2021-08-21: 40 mg via INTRAVENOUS
  Filled 2021-08-21: qty 10

## 2021-08-21 NOTE — Discharge Instructions (Addendum)
Your work-up today was reassuring against a serious cause of your abdominal pain.  I did send in Bentyl and Zofran for abdominal pain and nausea.  Also sent in Protonix to help with any acid reflux.  Take this until you establish care and follow-up with your primary care provider.  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  I recommend soft food diet until this improves.  If you have any worsening symptoms please return to the emergency room. ?

## 2021-08-21 NOTE — ED Provider Notes (Addendum)
?[facility] ?Provider Note ? ? ?CSN: [id number] ?Arrival date & time: 08/21/21  1801 ? ?  ? ?History ? ?Chief Complaint  ?Patient presents with  ? Facial Pain  ? Abdominal Pain  ? ? ?Kayla Wilcox is a 36 y.o. female. ? ?36 year old female with past medical history of ovarian cancer presents today for evaluation of jaw pain and abdominal pain associated with nausea and vomiting.  She states jaw pain started a couple hours ago.  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  She denies difficulty swallowing, fever, chills, drooling, or change in her voice.  She states abdominal pain has been going on since this morning and she has vomited multiple times at work today and was told to go home.  Abdominal pain is located in the epigastric region and does not radiate anywhere.  She is endorses small amount of blood with her last episode of emesis.  States emesis was tinged with blood.  Denies large-volume hematemesis, or hemoptysis.  Last bowel movement today.  States she is constipated.  Denies blood in stool, or hematochezia.  She states she is just coming off of her menstrual cycle.  She had lower abdominal cramps along with her menstrual cycle which is usual which have now resolved.  She also states 4 days ago she had a fever of 101.0 without any associated symptoms.  She states this is sometimes stable with her menstrual cycle.  She has not had any fever or associated symptoms between then and today until her symptoms came on. ? ?The history is provided by the patient. No language interpreter was used.  ? ?  ? ?Home Medications ?Prior to Admission medications   ?Medication Sig Start Date End Date Taking? Authorizing Provider  ?albuterol (VENTOLIN HFA) 108 (90 Base) MCG/ACT inhaler Inhale into the lungs. 05/24/17   [provider]  ?emtricitabine-tenofovir (TRUVADA) 200-300 MG tablet Take 1 tablet by mouth daily. 01/13/17    Palumbo, April, MD  ?loperamide (IMODIUM) 2 MG capsule Take 1 capsule (2 mg total) by mouth 4 (four) times daily as needed for diarrhea or loose stools. 08/20/20   Carlisle Cater, PA-C  ?naproxen (NAPROSYN) 500 MG tablet Take 1 tablet (500 mg total) by mouth 2 (two) times daily. 07/30/20   Horton, Barbette Hair, MD  ?promethazine (PHENERGAN) 25 MG tablet Take 1 tablet (25 mg total) by mouth every 6 (six) hours as needed for nausea or vomiting. 08/08/21   Hayden Rasmussen, MD  ?raltegravir (ISENTRESS) 400 MG tablet Take 1 tablet (400 mg total) by mouth 2 (two) times daily. 01/13/17   Palumbo, April, MD  ?   ? ?Allergies    ?Diphenhydramine hcl, Lisinopril, Cyclobenzaprine, Benadryl [diphenhydramine], Other, Red dye, Tomato, and Zofran [ondansetron]   ? ?Review of Systems   ?Review of Systems  ?Constitutional:  Negative for chills and fever.  ?HENT:  Negative for congestion, drooling, trouble swallowing and voice change.   ?Respiratory:  Negative for cough and shortness of breath.   ?Cardiovascular:  Negative for chest pain.  ?Gastrointestinal:  Positive for abdominal pain, constipation, nausea and vomiting. Negative for blood in stool and diarrhea.  ?Genitourinary:  Negative for dysuria and vaginal discharge.  ?Neurological:  Negative for weakness and light-headedness.  ?All other systems reviewed and are negative. ? ?Physical Exam ?Updated Vital Signs ?BP (!) 155/101 (BP Location: Left Arm)   Pulse (!) 59   Temp 98.4 ?F (  36.9 ?C) (Oral)   Resp 18   Ht '5\' 8"'$  (1.727 m)   Wt (!) 142.4 kg   LMP 08/17/2021   SpO2 99%   BMI 47.74 kg/m?  ?Physical Exam ?Vitals and nursing note reviewed.  ?Constitutional:   ?   General: She is not in acute distress. ?   Appearance: Normal appearance. She is not ill-appearing.  ?HENT:  ?   Head: Normocephalic and atraumatic.  ?   Nose: Nose normal.  ?   Mouth/Throat:  ?   Dentition: Normal dentition. No dental tenderness, gingival swelling, dental caries or dental abscesses.  ?   Pharynx:  No pharyngeal swelling, oropharyngeal exudate, posterior oropharyngeal erythema or uvula swelling.  ?   Tonsils: No tonsillar exudate or tonsillar abscesses.  ?Eyes:  ?   General: No scleral icterus. ?   Extraocular Movements: Extraocular movements intact.  ?   Conjunctiva/sclera: Conjunctivae normal.  ?Cardiovascular:  ?   Rate and Rhythm: Normal rate and regular rhythm.  ?   Pulses: Normal pulses.  ?Pulmonary:  ?   Effort: Pulmonary effort is normal. No respiratory distress.  ?   Breath sounds: Normal breath sounds. No wheezing or rales.  ?Abdominal:  ?   General: There is no distension.  ?   Palpations: Abdomen is soft.  ?   Tenderness: There is abdominal tenderness. There is no right CVA tenderness, left CVA tenderness, guarding or rebound.  ?Musculoskeletal:     ?   General: Normal range of motion.  ?   Cervical back: Normal range of motion.  ?Skin: ?   General: Skin is warm and dry.  ?Neurological:  ?   General: No focal deficit present.  ?   Mental Status: She is alert. Mental status is at baseline.  ? ? ?ED Results / Procedures / Treatments   ?Labs ?(all labs ordered are listed, but only abnormal results are displayed) ?Labs Reviewed  ?CBC WITH DIFFERENTIAL/PLATELET  ?COMPREHENSIVE METABOLIC PANEL  ?LIPASE, BLOOD  ?URINALYSIS, ROUTINE W REFLEX MICROSCOPIC  ?I-STAT BETA HCG BLOOD, ED ([facility], [facility], [facility] ONLY)  ? ? ?EKG ?None ? ?Radiology ?No results found. ? ?Procedures ?Procedures  ? ? ?Medications Ordered in ED ?Medications  ?prochlorperazine (COMPAZINE) injection 10 mg (has no administration in time range)  ?pantoprazole (PROTONIX) injection 40 mg (40 mg Intravenous Given 08/21/21 1844)  ?ketorolac (TORADOL) 15 MG/ML injection 15 mg (15 mg Intravenous Given 08/21/21 1846)  ? ? ?ED Course/ Medical Decision Making/ A&P ?  ?                        ?Medical Decision Making ?Amount and/or Complexity of Data Reviewed ?Labs: ordered. ?Radiology: ordered. ? ?Risk ?Prescription drug management. ? ? ?Medical Decision Making /  ED Course ? ? ?This patient presents to the ED for concern of abdominal pain, jaw pain, this involves an extensive number of treatment options, and is a complaint that carries with it a high risk of complications and morbidity.  The differential diagnosis includes dental abscess, PTA, RPA, pancreatitis, diverticulitis, colitis, cholecystitis ? ?MDM: ?36 year old female with past medical history of ovarian cancer presents today for evaluation of jaw pain and abdominal pain.  Abdominal pain came on today located in the epigastric region with associated nausea, vomiting, emesis that was tinged with blood.  Denies dysuria vaginal discharge, diarrhea.  Does have constipation.  Will evaluate with CBC, CMP, lipase, UA, CT abdomen pelvis with contrast.  Jaw pain is most consistent  with her previous history of TMJ.  Without any evidence of abscess or other concerning acute findings on oral exam.  ?On reevaluation patient does report significant improvement in symptoms.  CT abdomen pelvis without any acute intra-abdominal findings.  Does show diverticulosis however no diverticulitis.  CBC without leukocytosis or anemia.  Unlikely to be acute GI bleed.  CMP with potassium 3.4 otherwise without acute findings.  Lipase within normal limits.  UA without UTI.  Patient is appropriate for discharge.  Discharged in stable condition.  Discussed soft food diet to assist with a TMJ flareup.  Discussed importance of follow-up with her dentist.  Patient does not have PCP.  Will provide information to establish with 1.  Patient voices understanding and is in agreement with plan.  Will provide with Bentyl and Zofran.  Will provide Protonix in case there is a GERD component. ? ?Lab Tests: ?-I ordered, reviewed, and interpreted labs.   ?The pertinent results include:   ?Labs Reviewed  ?CBC WITH DIFFERENTIAL/PLATELET  ?COMPREHENSIVE METABOLIC PANEL  ?LIPASE, BLOOD  ?URINALYSIS, ROUTINE W REFLEX MICROSCOPIC  ?I-STAT BETA HCG BLOOD, ED ([facility], [facility],  [facility] ONLY)  ?  ? ? ?EKG ? EKG Interpretation ? ?Date/Time:    ?Ventricular Rate:    ?PR Interval:    ?QRS Duration:   ?QT Interval:    ?QTC Calculation:   ?R Axis:     ?Text Interpretation:   ?  ? ?  ? ? ? ?Imagin

## 2021-08-21 NOTE — ED Triage Notes (Signed)
Pt arrives pov, c/o acute left side jaw pain x 1 hr pta. Pt also c/o upper abdominal pain with n/v.    ?

## 2021-08-21 NOTE — ED Notes (Addendum)
Report given to oncoming RN Jorene Guest. Urine to be collected, Pt in BR at this time. Steady gate noted.  ?

## 2023-08-03 IMAGING — CT CT ABD-PELV W/ CM
2 of 5 series · 16 of 46 positions shown, 18 images · IV contrast (Omnipaque)
Comparison: Prior study from 08/08/2019.

CLINICAL DATA: Initial evaluation for acute nonlocalized abdominal
pain.

EXAM:
CT ABDOMEN AND PELVIS WITH CONTRAST
TECHNIQUE: Multidetector CT imaging of the abdomen and pelvis was performed
using the standard protocol following bolus administration of
intravenous contrast.

[Series 2: axial st · axial · 0.98mm/px · z∈[-484,-58]mm · 13 of 97 slices shown, 15 images]
[im 6/97  soft-tissue]
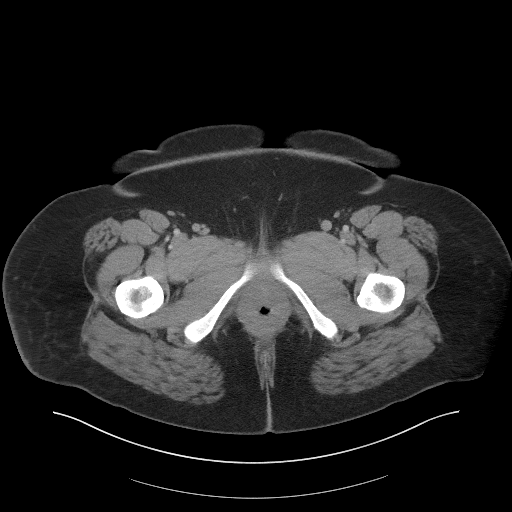
[im 6/97  bone]
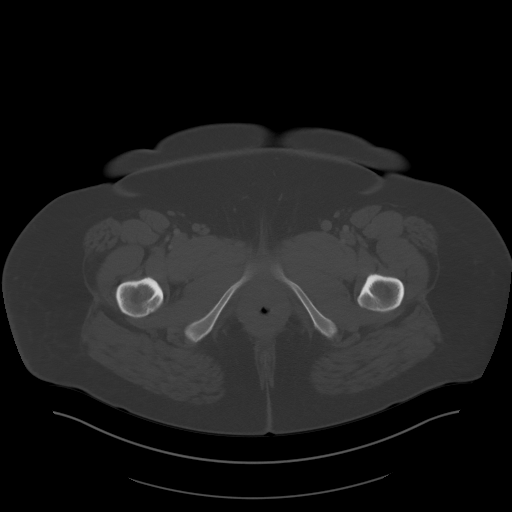
[im 11/97  soft-tissue]
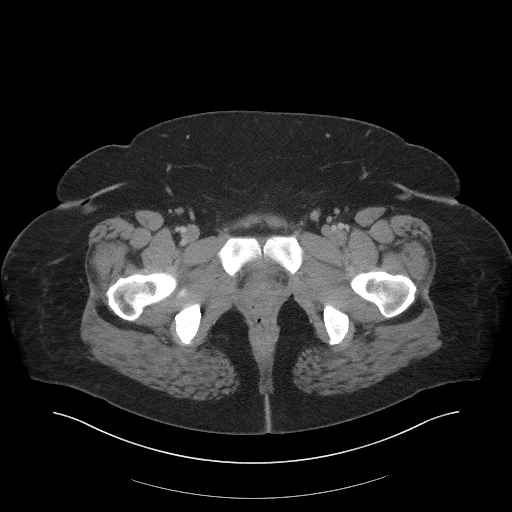
[im 22/97  soft-tissue]
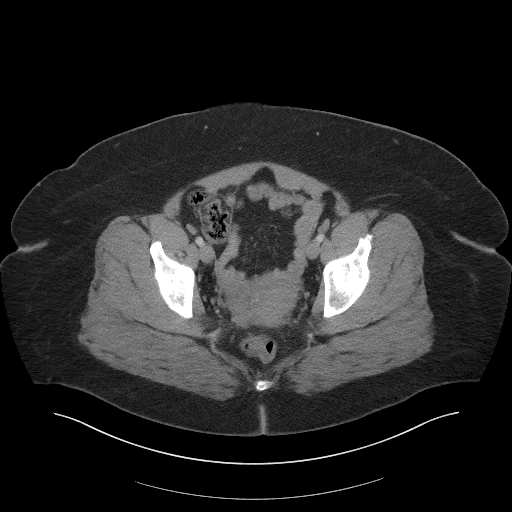
[im 27/97  soft-tissue]
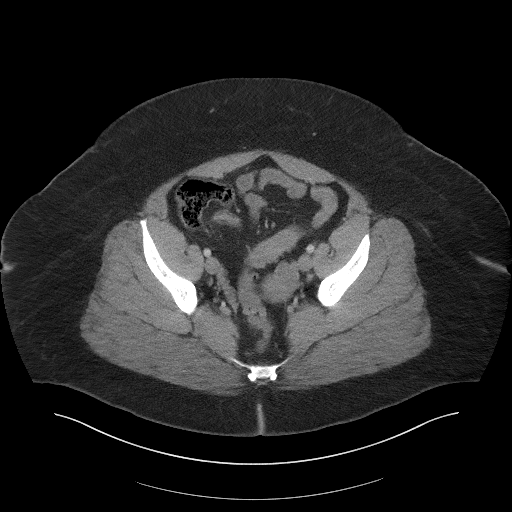
[im 33/97  soft-tissue]
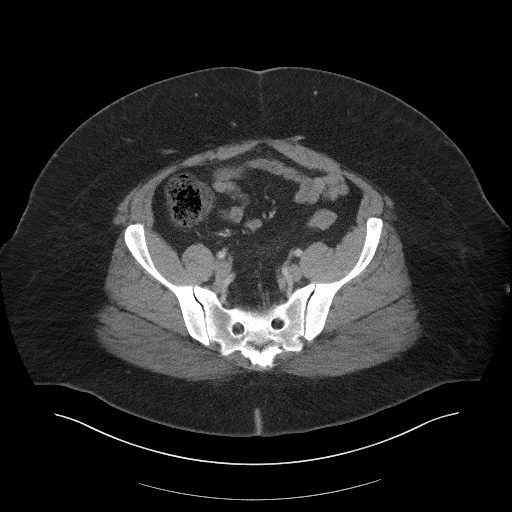
[im 43/97  soft-tissue]
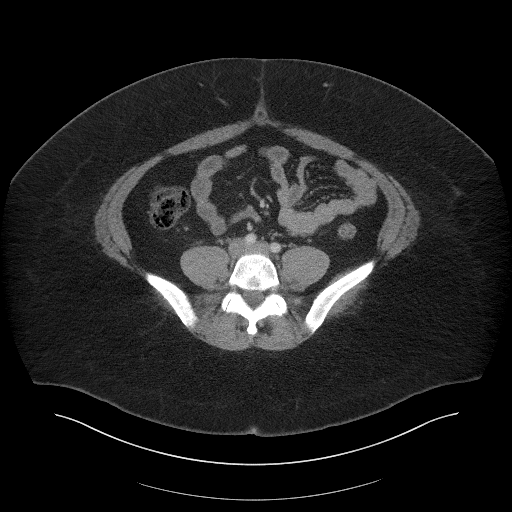
[im 49/97  soft-tissue]
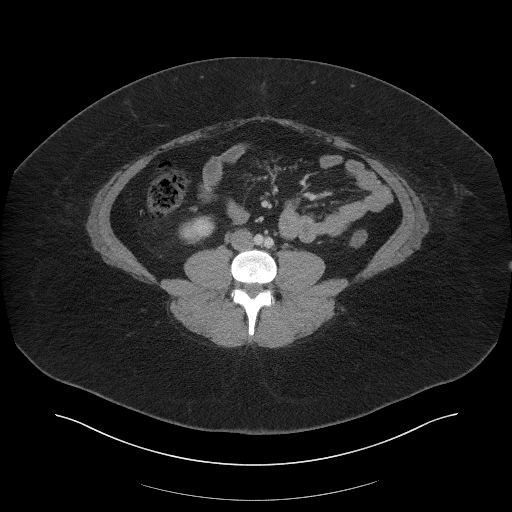
[im 54/97  soft-tissue]
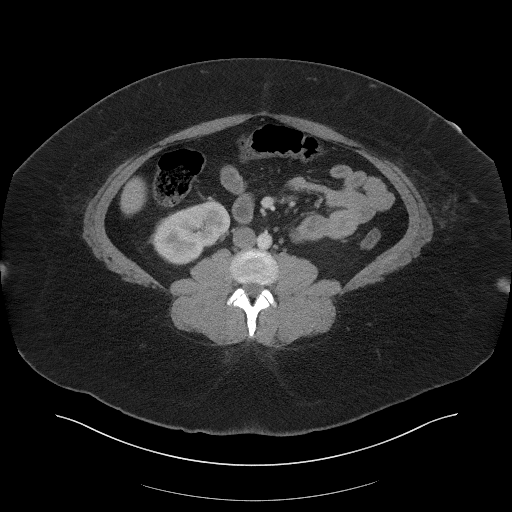
[im 65/97  soft-tissue]
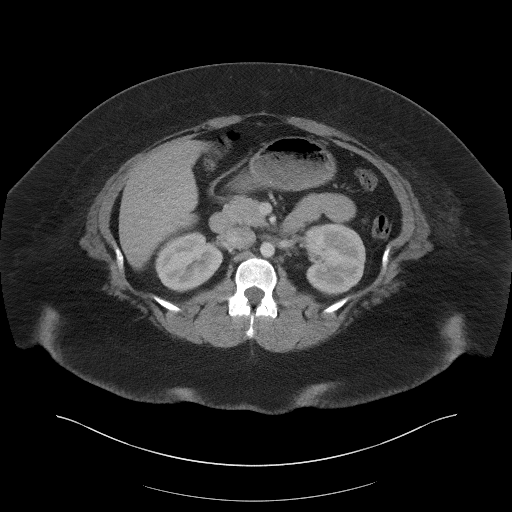
[im 65/97  bone]
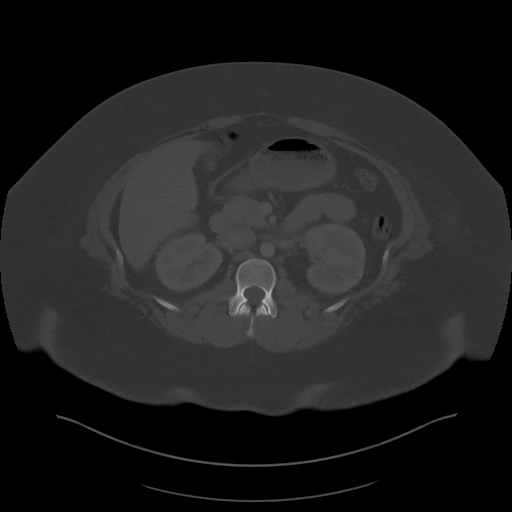
[im 70/97  soft-tissue]
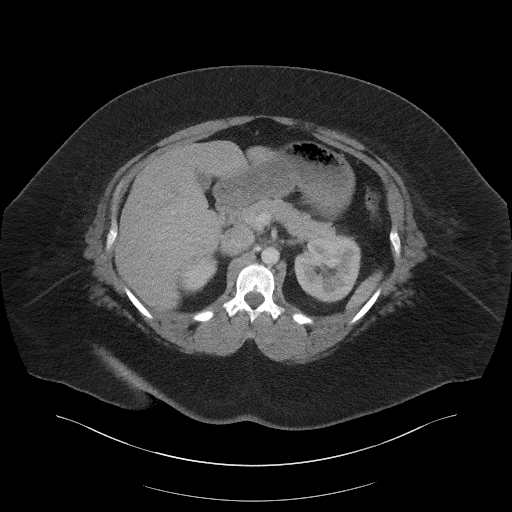
[im 75/97  soft-tissue]
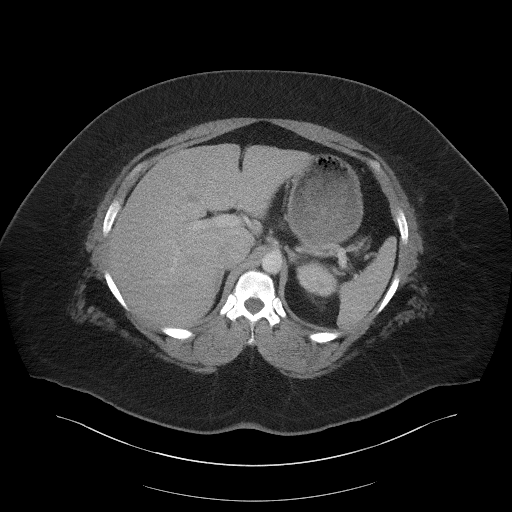
[im 86/97  soft-tissue]
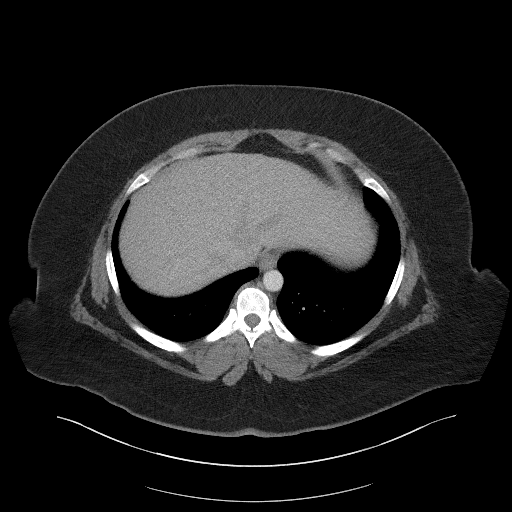
[im 91/97  soft-tissue]
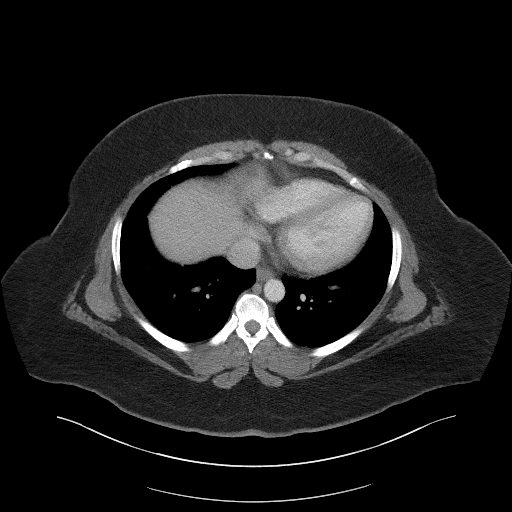

[Series 5: coronal st · coronal · 0.98mm/px · 3 of 135 slices shown]
[im 45/135  soft-tissue]
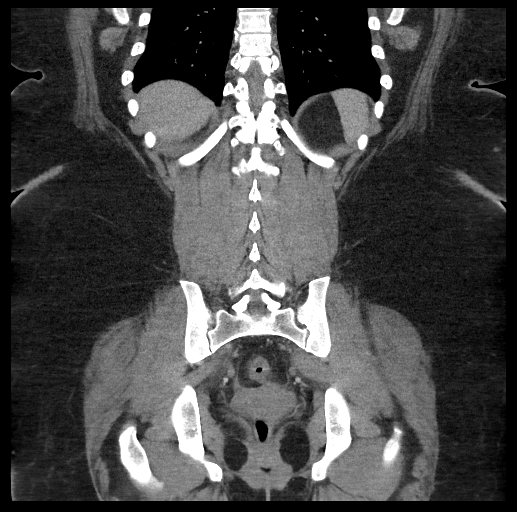
[im 60/135  soft-tissue]
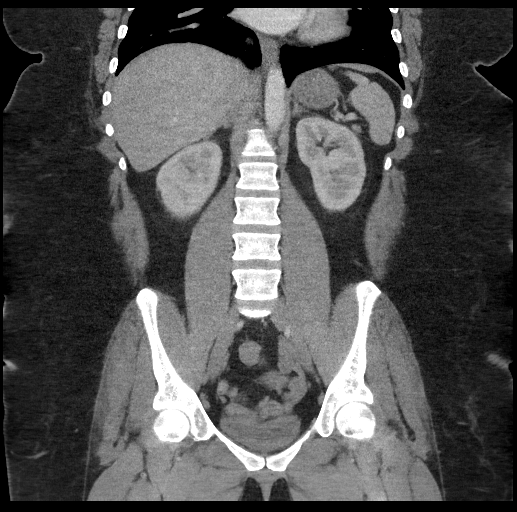
[im 75/135  soft-tissue]
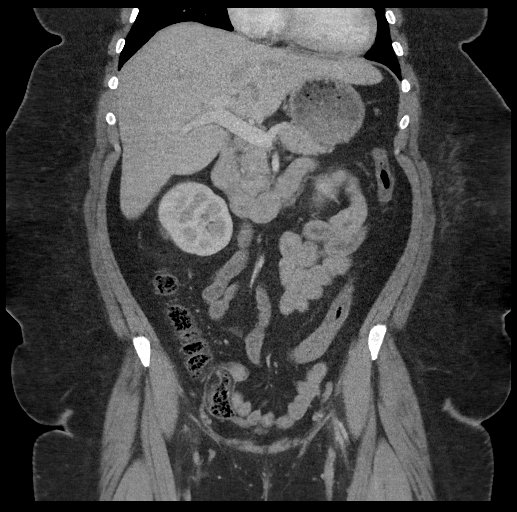

[16 of 46 positions shown; findings below may reference images not displayed]

RADIATION DOSE REDUCTION: This exam was performed according to the
departmental dose-optimization program which includes automated
exposure control, adjustment of the mA and/or kV according to
patient size and/or use of iterative reconstruction technique.

CONTRAST:  100mL OMNIPAQUE IOHEXOL 350 MG/ML SOLN
FINDINGS: Lower chest: Visualized lung bases are clear.

Hepatobiliary: Few tiny subcentimeter hypodensities noted within the
liver, too small the characterize, but could reflect small cysts, of
doubtful significance. Liver otherwise unremarkable. Gallbladder
within normal limits. No biliary dilatation.

Pancreas: Unremarkable. No pancreatic ductal dilatation or
surrounding inflammatory changes.

Spleen: Normal in size without focal abnormality.

Adrenals/Urinary Tract: Adrenal glands within normal limits. Kidneys
equal size with symmetric enhancement. No nephrolithiasis,
hydronephrosis, or focal enhancing renal mass. No hydroureter.
Bladder within normal limits.

Stomach/Bowel: Stomach within normal limits. No evidence for bowel
obstruction. Appendix within normal limits. Colon is largely
decompressed. No acute inflammatory changes seen about the bowels.

Vascular/Lymphatic: No significant vascular findings are present. No
enlarged abdominal or pelvic lymph nodes.

Reproductive: Uterus and bilateral adnexa are unremarkable.

Other: No free air or fluid.  No hernia.

Musculoskeletal: No acute osseous finding. No discrete or worrisome
osseous lesions.
IMPRESSION: 1. No CT evidence for acute intra-abdominal or pelvic process.
2. Colonic diverticulosis without evidence for acute diverticulitis.
